# Patient Record
Sex: Female | Born: 1995
Health system: Southern US, Community
[De-identification: ages and names within clinical notes are randomized; demographics above are authoritative.]

## PROBLEM LIST (undated history)

## (undated) DIAGNOSIS — F1910 Other psychoactive substance abuse, uncomplicated: Secondary | ICD-10-CM

## (undated) DIAGNOSIS — O24419 Gestational diabetes mellitus in pregnancy, unspecified control: Secondary | ICD-10-CM

## (undated) DIAGNOSIS — O903 Peripartum cardiomyopathy: Secondary | ICD-10-CM

## (undated) DIAGNOSIS — I10 Essential (primary) hypertension: Secondary | ICD-10-CM

## (undated) DIAGNOSIS — I509 Heart failure, unspecified: Secondary | ICD-10-CM

## (undated) DIAGNOSIS — F329 Major depressive disorder, single episode, unspecified: Secondary | ICD-10-CM

## (undated) DIAGNOSIS — F99 Mental disorder, not otherwise specified: Secondary | ICD-10-CM

## (undated) DIAGNOSIS — R519 Headache, unspecified: Secondary | ICD-10-CM

## (undated) DIAGNOSIS — F1990 Other psychoactive substance use, unspecified, uncomplicated: Secondary | ICD-10-CM

## (undated) DIAGNOSIS — Z789 Other specified health status: Secondary | ICD-10-CM

## (undated) HISTORY — PX: NO PAST SURGERIES: SHX2092

## (undated) HISTORY — DX: Mental disorder, not otherwise specified: F99

## (undated) HISTORY — DX: Other specified health status: Z78.9

---

## 2016-03-17 NOTE — L&D Delivery Note (Signed)
Delivery Note At 6:45 PM a viable female was delivered via Vaginal, Spontaneous Delivery (Presentation: cephalic ; LOA ).  APGAR: 8, 9; weight  pending.   Placenta status: complete , 3 vessel.  Cord:  with the following complications: nuchal, reduced immediately post partum.  Cord pH: pending  Anesthesia:   Episiotomy: None Lacerations:  2nd degree, bilateral labial tear Suture Repair: 2.0 3.0 vicryl Est. Blood Loss (mL):  500  Mom to postpartum.  Baby to Couplet care / Skin to Skin.  Dyke Maes Cimolino, PA-S 12/25/2016, 7:45 PM  VAGINAL DELIVERY ATTESTATION  I was gloved and present for the delivery in its entirety, and I agree with the above resident's note.    [name], CNM 7:48 PM

## 2016-05-07 ENCOUNTER — Emergency Department (HOSPITAL_BASED_OUTPATIENT_CLINIC_OR_DEPARTMENT_OTHER)
Admission: EM | Admit: 2016-05-07 | Discharge: 2016-05-07 | Disposition: A | Discharge status: Home / Self Care | Payer: Self-pay | Attending: Dermatology | Admitting: Dermatology

## 2016-05-07 ENCOUNTER — Encounter (HOSPITAL_BASED_OUTPATIENT_CLINIC_OR_DEPARTMENT_OTHER): Payer: Self-pay | Admitting: *Deleted

## 2016-05-07 DIAGNOSIS — Z3A00 Weeks of gestation of pregnancy not specified: Secondary | ICD-10-CM | POA: Insufficient documentation

## 2016-05-07 DIAGNOSIS — F17210 Nicotine dependence, cigarettes, uncomplicated: Secondary | ICD-10-CM | POA: Insufficient documentation

## 2016-05-07 DIAGNOSIS — R1030 Lower abdominal pain, unspecified: Secondary | ICD-10-CM | POA: Insufficient documentation

## 2016-05-07 DIAGNOSIS — Z5321 Procedure and treatment not carried out due to patient leaving prior to being seen by health care provider: Secondary | ICD-10-CM | POA: Insufficient documentation

## 2016-05-07 DIAGNOSIS — O26899 Other specified pregnancy related conditions, unspecified trimester: Secondary | ICD-10-CM | POA: Insufficient documentation

## 2016-05-07 DIAGNOSIS — O99330 Smoking (tobacco) complicating pregnancy, unspecified trimester: Secondary | ICD-10-CM | POA: Insufficient documentation

## 2016-05-07 LAB — URINALYSIS, MICROSCOPIC (REFLEX)

## 2016-05-07 LAB — URINALYSIS, ROUTINE W REFLEX MICROSCOPIC
Bilirubin Urine: NEGATIVE
GLUCOSE, UA: NEGATIVE mg/dL
HGB URINE DIPSTICK: NEGATIVE
Ketones, ur: NEGATIVE mg/dL
Nitrite: NEGATIVE
Protein, ur: NEGATIVE mg/dL
SPECIFIC GRAVITY, URINE: 1.008 (ref 1.005–1.030)
pH: 7 (ref 5.0–8.0)

## 2016-05-07 LAB — PREGNANCY, URINE: Preg Test, Ur: POSITIVE — AB

## 2016-05-07 NOTE — ED Notes (Signed)
Called x 1 for room placement 

## 2016-05-07 NOTE — ED Notes (Signed)
No answer when called for exam room.  

## 2016-05-07 NOTE — ED Notes (Signed)
2nd time- no answer when called for triage

## 2016-05-07 NOTE — ED Triage Notes (Signed)
Pt c/o lower abd pain x 1 week, home preg test x 2 , no prenatal care, denies vag bleeding

## 2016-05-27 ENCOUNTER — Encounter: Payer: Self-pay | Admitting: Adult Health

## 2016-06-09 ENCOUNTER — Encounter: Payer: Self-pay | Admitting: Adult Health

## 2016-06-09 ENCOUNTER — Encounter: Payer: Self-pay | Admitting: *Deleted

## 2016-06-24 ENCOUNTER — Encounter (HOSPITAL_BASED_OUTPATIENT_CLINIC_OR_DEPARTMENT_OTHER): Payer: Self-pay | Admitting: *Deleted

## 2016-06-24 DIAGNOSIS — O2341 Unspecified infection of urinary tract in pregnancy, first trimester: Secondary | ICD-10-CM | POA: Diagnosis not present

## 2016-06-24 DIAGNOSIS — R102 Pelvic and perineal pain: Secondary | ICD-10-CM | POA: Diagnosis not present

## 2016-06-24 DIAGNOSIS — O99331 Smoking (tobacco) complicating pregnancy, first trimester: Secondary | ICD-10-CM | POA: Diagnosis not present

## 2016-06-24 DIAGNOSIS — F17210 Nicotine dependence, cigarettes, uncomplicated: Secondary | ICD-10-CM | POA: Diagnosis not present

## 2016-06-24 DIAGNOSIS — Z3A14 14 weeks gestation of pregnancy: Secondary | ICD-10-CM | POA: Diagnosis not present

## 2016-06-24 DIAGNOSIS — O219 Vomiting of pregnancy, unspecified: Secondary | ICD-10-CM | POA: Diagnosis present

## 2016-06-24 DIAGNOSIS — Z5181 Encounter for therapeutic drug level monitoring: Secondary | ICD-10-CM | POA: Diagnosis not present

## 2016-06-24 DIAGNOSIS — O23591 Infection of other part of genital tract in pregnancy, first trimester: Secondary | ICD-10-CM | POA: Diagnosis not present

## 2016-06-24 LAB — DIFFERENTIAL
Basophils Absolute: 0 10*3/uL (ref 0.0–0.1)
Basophils Relative: 0 %
Eosinophils Absolute: 0.1 10*3/uL (ref 0.0–0.7)
Eosinophils Relative: 2 %
LYMPHS PCT: 30 %
Lymphs Abs: 2.8 10*3/uL (ref 0.7–4.0)
MONO ABS: 0.4 10*3/uL (ref 0.1–1.0)
Monocytes Relative: 4 %
NEUTROS ABS: 5.8 10*3/uL (ref 1.7–7.7)
NEUTROS PCT: 64 %

## 2016-06-24 LAB — CBC
HCT: 35.9 % — ABNORMAL LOW (ref 36.0–46.0)
HEMOGLOBIN: 12.8 g/dL (ref 12.0–15.0)
MCH: 31.3 pg (ref 26.0–34.0)
MCHC: 35.7 g/dL (ref 30.0–36.0)
MCV: 87.8 fL (ref 78.0–100.0)
Platelets: 292 10*3/uL (ref 150–400)
RBC: 4.09 MIL/uL (ref 3.87–5.11)
RDW: 11.9 % (ref 11.5–15.5)
WBC: 9.1 10*3/uL (ref 4.0–10.5)

## 2016-06-24 NOTE — ED Triage Notes (Signed)
She is 4 months pregnant. Has received no prenatal care. Here today with vomiting and lower abdominal pain since this am. Thick white vaginal discharge.

## 2016-06-24 NOTE — ED Notes (Signed)
Patient reassessed, unable to urinate at this time. Reports that she continues to have nausea. Explained to the patient that we have to verify pregnancy prior to giving zofran

## 2016-06-25 ENCOUNTER — Emergency Department (HOSPITAL_BASED_OUTPATIENT_CLINIC_OR_DEPARTMENT_OTHER)
Admission: EM | Admit: 2016-06-25 | Discharge: 2016-06-25 | Disposition: A | Discharge status: Home / Self Care | Payer: Medicaid Other | Attending: Emergency Medicine | Admitting: Emergency Medicine

## 2016-06-25 DIAGNOSIS — B3749 Other urogenital candidiasis: Secondary | ICD-10-CM

## 2016-06-25 DIAGNOSIS — O219 Vomiting of pregnancy, unspecified: Secondary | ICD-10-CM

## 2016-06-25 DIAGNOSIS — O2341 Unspecified infection of urinary tract in pregnancy, first trimester: Secondary | ICD-10-CM

## 2016-06-25 LAB — RAPID URINE DRUG SCREEN, HOSP PERFORMED
Amphetamines: NOT DETECTED
BARBITURATES: NOT DETECTED
Benzodiazepines: NOT DETECTED
COCAINE: NOT DETECTED
Opiates: NOT DETECTED
Tetrahydrocannabinol: POSITIVE — AB

## 2016-06-25 LAB — URINALYSIS, ROUTINE W REFLEX MICROSCOPIC
Glucose, UA: NEGATIVE mg/dL
Hgb urine dipstick: NEGATIVE
Ketones, ur: NEGATIVE mg/dL
NITRITE: POSITIVE — AB
PROTEIN: NEGATIVE mg/dL
SPECIFIC GRAVITY, URINE: 1.021 (ref 1.005–1.030)
pH: 7 (ref 5.0–8.0)

## 2016-06-25 LAB — LIPASE, BLOOD: LIPASE: 18 U/L (ref 11–51)

## 2016-06-25 LAB — WET PREP, GENITAL
Clue Cells Wet Prep HPF POC: NONE SEEN
SPERM: NONE SEEN
TRICH WET PREP: NONE SEEN

## 2016-06-25 LAB — COMPREHENSIVE METABOLIC PANEL
ALT: 9 U/L — AB (ref 14–54)
AST: 15 U/L (ref 15–41)
Albumin: 3.9 g/dL (ref 3.5–5.0)
Alkaline Phosphatase: 54 U/L (ref 38–126)
Anion gap: 7 (ref 5–15)
BUN: 6 mg/dL (ref 6–20)
CHLORIDE: 103 mmol/L (ref 101–111)
CO2: 25 mmol/L (ref 22–32)
CREATININE: 0.38 mg/dL — AB (ref 0.44–1.00)
Calcium: 9.5 mg/dL (ref 8.9–10.3)
GFR calc Af Amer: >60 mL/min (ref >60)
GFR calc non Af Amer: >60 mL/min (ref >60)
Glucose, Bld: 98 mg/dL (ref 65–99)
POTASSIUM: 3.3 mmol/L — AB (ref 3.5–5.1)
SODIUM: 135 mmol/L (ref 135–145)
Total Bilirubin: 0.5 mg/dL (ref 0.3–1.2)
Total Protein: 7.3 g/dL (ref 6.5–8.1)

## 2016-06-25 LAB — PREGNANCY, URINE: Preg Test, Ur: POSITIVE — AB

## 2016-06-25 LAB — URINALYSIS, MICROSCOPIC (REFLEX)

## 2016-06-25 LAB — GC/CHLAMYDIA PROBE AMP (~~LOC~~) NOT AT ARMC
CHLAMYDIA, DNA PROBE: NEGATIVE
NEISSERIA GONORRHEA: NEGATIVE

## 2016-06-25 LAB — HCG, QUANTITATIVE, PREGNANCY: hCG, Beta Chain, Quant, S: 67563 m[IU]/mL — ABNORMAL HIGH (ref <5)

## 2016-06-25 MED ORDER — CEPHALEXIN 500 MG PO CAPS: 500.0000 mg | ORAL_CAPSULE | Freq: Two times a day (BID) | ORAL | 0 refills | Status: DC

## 2016-06-25 MED ORDER — MICONAZOLE NITRATE 2 % VA CREA: 1.0000 | TOPICAL_CREAM | Freq: Every day | VAGINAL | 0 refills | Status: DC

## 2016-06-25 MED ORDER — METOCLOPRAMIDE HCL 10 MG PO TABS
10.0000 mg | ORAL_TABLET | Freq: Once | ORAL | Status: AC
  Administered 2016-06-25: 10 mg via ORAL
  Filled 2016-06-25: qty 1

## 2016-06-25 MED ORDER — PRENATAL VITAMINS 28-0.8 MG PO TABS: 1.0000 | ORAL_TABLET | Freq: Every day | ORAL | 1 refills | Status: DC

## 2016-06-25 MED ORDER — METOCLOPRAMIDE HCL 10 MG PO TABS: 10.0000 mg | ORAL_TABLET | Freq: Four times a day (QID) | ORAL | 0 refills | Status: DC | PRN

## 2016-06-25 NOTE — ED Provider Notes (Signed)
[facility] Provider Note: Lowella Dell, MD, FACEP  CSN: [id number] MRN: [id number] ARRIVAL: 06/24/16 at 2221 ROOM: [facility]08/[facility]08   CHIEF COMPLAINT  Vomiting   HISTORY OF PRESENT ILLNESS  Laura Wagner is a 21 y.o. female who is at least several weeks pregnant (LMP 03/16/2016). She is here with nausea and vomiting that began yesterday. She was unable to keep anything on her stomach. She denies associated abdominal pain, diarrhea or dysuria. She denies vaginal bleeding that has had a vaginal discharge. She was given Reglan orally prior to my evaluation and has subsequently been able to drink fluids without emesis.   History reviewed. No pertinent past medical history.  History reviewed. No pertinent surgical history.  No family history on file.  Social History  Substance Use Topics  . Smoking status: Current Every Day Smoker    Packs/day: 1.00    Types: Cigarettes  . Smokeless tobacco: Never Used  . Alcohol use No    Prior to Admission medications   Not on File    Allergies Patient has no known allergies.   REVIEW OF SYSTEMS  Negative except as noted here or in the History of Present Illness.   PHYSICAL EXAMINATION  Initial Vital Signs Blood pressure 108/64, pulse 77, temperature 97.8 F (36.6 C), temperature source Oral, resp. rate 16, height  (1.499 m), weight 108 lb (49 kg), last menstrual period 03/16/2016, SpO2 100 %.  Examination General: Well-developed, well-nourished female in no acute distress; appearance consistent with age of record HENT: normocephalic; atraumatic Eyes: pupils equal, round and reactive to light; extraocular muscles intact Neck: supple Heart: regular rate and rhythm Lungs: clear to auscultation bilaterally Abdomen: soft; nondistended; mild suprapubic tenderness; no masses or hepatosplenomegaly; bowel sounds present GU: Normal external genitalia; no vaginal bleeding; mucoid cervical discharge; no cervical motion  tenderness; no adnexal tenderness; mildly enlarged uterus; viable IUP seen on bedside ultrasound Extremities: No deformity; full range of motion; pulses normal Neurologic: Awake, alert and oriented; motor function intact in all extremities and symmetric; no facial droop Skin: Warm and dry Psychiatric: Flat affect   RESULTS  Summary of this visit's results, reviewed by myself:   EKG Interpretation  Date/Time:    Ventricular Rate:    PR Interval:    QRS Duration:   QT Interval:    QTC Calculation:   R Axis:     Text Interpretation:        Laboratory Studies: Results for orders placed or performed during the hospital encounter of 06/25/16 (from the past 24 hour(s))  Lipase, blood     Status: None   Collection Time: 06/24/16 11:37 PM  Result Value Ref Range   Lipase 18 11 - 51 U/L  Comprehensive metabolic panel     Status: Abnormal   Collection Time: 06/24/16 11:37 PM  Result Value Ref Range   Sodium 135 135 - 145 mmol/L   Potassium 3.3 (L) 3.5 - 5.1 mmol/L   Chloride 103 101 - 111 mmol/L   CO2 25 22 - 32 mmol/L   Glucose, Bld 98 65 - 99 mg/dL   BUN 6 6 - 20 mg/dL   Creatinine, Ser 2.53 (L) 0.44 - 1.00 mg/dL   Calcium 9.5 8.9 - 66.4 mg/dL   Total Protein 7.3 6.5 - 8.1 g/dL   Albumin 3.9 3.5 - 5.0 g/dL   AST 15 15 - 41 U/L   ALT 9 (L) 14 - 54 U/L   Alkaline Phosphatase 54 38 - 126 U/L  Total Bilirubin 0.5 0.3 - 1.2 mg/dL   GFR calc non Af Amer >60 >60 mL/min   GFR calc Af Amer >60 >60 mL/min   Anion gap 7 5 - 15  CBC     Status: Abnormal   Collection Time: 06/24/16 11:37 PM  Result Value Ref Range   WBC 9.1 4.0 - 10.5 K/uL   RBC 4.09 3.87 - 5.11 MIL/uL   Hemoglobin 12.8 12.0 - 15.0 g/dL   HCT 16.1 (L) 09.6 - 04.5 %   MCV 87.8 78.0 - 100.0 fL   MCH 31.3 26.0 - 34.0 pg   MCHC 35.7 30.0 - 36.0 g/dL   RDW 40.9 81.1 - 91.4 %   Platelets 292 150 - 400 K/uL  Differential     Status: None   Collection Time: 06/24/16 11:37 PM  Result Value Ref Range   Neutrophils  Relative % 64 %   Neutro Abs 5.8 1.7 - 7.7 K/uL   Lymphocytes Relative 30 %   Lymphs Abs 2.8 0.7 - 4.0 K/uL   Monocytes Relative 4 %   Monocytes Absolute 0.4 0.1 - 1.0 K/uL   Eosinophils Relative 2 %   Eosinophils Absolute 0.1 0.0 - 0.7 K/uL   Basophils Relative 0 %   Basophils Absolute 0.0 0.0 - 0.1 K/uL  hCG, quantitative, pregnancy     Status: Abnormal   Collection Time: 06/24/16 11:37 PM  Result Value Ref Range   hCG, Beta Chain, Quant, S 78,295 (H) <5 mIU/mL  Pregnancy, urine     Status: Abnormal   Collection Time: 06/25/16 12:22 AM  Result Value Ref Range   Preg Test, Ur POSITIVE (A) NEGATIVE  Urinalysis, Routine w reflex microscopic     Status: Abnormal   Collection Time: 06/25/16 12:22 AM  Result Value Ref Range   Color, Urine AMBER (A) YELLOW   APPearance CLOUDY (A) CLEAR   Specific Gravity, Urine 1.021 1.005 - 1.030   pH 7.0 5.0 - 8.0   Glucose, UA NEGATIVE NEGATIVE mg/dL   Hgb urine dipstick NEGATIVE NEGATIVE   Bilirubin Urine SMALL (A) NEGATIVE   Ketones, ur NEGATIVE NEGATIVE mg/dL   Protein, ur NEGATIVE NEGATIVE mg/dL   Nitrite POSITIVE (A) NEGATIVE   Leukocytes, UA MODERATE (A) NEGATIVE  Urinalysis, Microscopic (reflex)     Status: Abnormal   Collection Time: 06/25/16 12:22 AM  Result Value Ref Range   RBC / HPF 0-5 0 - 5 RBC/hpf   WBC, UA 6-30 0 - 5 WBC/hpf   Bacteria, UA MANY (A) NONE SEEN   Squamous Epithelial / LPF 6-30 (A) NONE SEEN   Budding Yeast PRESENT    Ca Oxalate Crys, UA PRESENT   Rapid urine drug screen (hospital performed)     Status: Abnormal   Collection Time: 06/25/16 12:22 AM  Result Value Ref Range   Opiates NONE DETECTED NONE DETECTED   Cocaine NONE DETECTED NONE DETECTED   Benzodiazepines NONE DETECTED NONE DETECTED   Amphetamines NONE DETECTED NONE DETECTED   Tetrahydrocannabinol POSITIVE (A) NONE DETECTED   Barbiturates NONE DETECTED NONE DETECTED  Wet prep, genital     Status: Abnormal   Collection Time: 06/25/16  3:20 AM    Result Value Ref Range   Yeast Wet Prep HPF POC PRESENT (A) NONE SEEN   Trich, Wet Prep NONE SEEN NONE SEEN   Clue Cells Wet Prep HPF POC NONE SEEN NONE SEEN   WBC, Wet Prep HPF POC MANY (A) NONE SEEN   Sperm NONE SEEN  Imaging Studies: No results found.  ED COURSE  Nursing notes and initial vitals signs, including pulse oximetry, reviewed.  Vitals:   06/24/16 2235 06/24/16 2240 06/25/16 0148  BP:  123/81 108/64  Pulse:  98 77  Resp:  18 16  Temp:  97.8 F (36.6 C)   TempSrc:  Oral   SpO2:  100% 100%  Weight: 108 lb (49 kg)    Height:  (1.499 m)     3:44 AM The patient has a family practice PCP. She had a recent appointment with her but did not follow-up. She was advised to follow-up as she needs regular prenatal care.  PROCEDURES    ED DIAGNOSES     ICD-9-CM ICD-10-CM   1. Nausea and vomiting during pregnancy 643.90 O21.9   2. Urinary tract infection in mother during first trimester of pregnancy 646.63 O23.41    599.0    3. Candida infection of genital region 112.2 B37.49        Paula Libra, MD 06/25/16 0345

## 2016-06-25 NOTE — ED Notes (Signed)
Pt sleeping in bed when nurse goes in room.  Asked pt when her LMP was and she states it was the "end of December."  Asked pt what brings her in and pt states she "can't keep anything down."  Pt has not vomited since arrival and does not appear dehydrated at all.  Asked pt if she is having discharge and pt denies.  Asked pt about nurse's note in triage that suggests she is having discharge and pt then states that she is in fact having discharge.  Asked pt if she has an Chief Financial Officer and pt denies.  Asked pt when her last pelvic exam was, pt denies knowing when.  Asked pt when last STD check was and pt informs nurse that it was a few months ago.  When asked where she had her exam, pt stated that her PCP did a "PAP smear."  Clarified with pt that her "PCP" is actually her OB-GYN.  Pt had an appointment "a few weeks back, but I missed it."  Asked pt if she rescheduled and pt states she did not.  Pt is a G2P0 with a spontaneous abortion at 4 weeks.

## 2016-06-25 NOTE — ED Notes (Signed)
Pt verbalizes understanding of d/c instructions and denies any further needs at this time. 

## 2016-06-26 LAB — HIV ANTIBODY (ROUTINE TESTING W REFLEX): HIV Screen 4th Generation wRfx: NONREACTIVE

## 2016-06-27 LAB — URINE CULTURE

## 2016-06-28 ENCOUNTER — Telehealth: Payer: Self-pay

## 2016-06-28 NOTE — Telephone Encounter (Signed)
Post ED Visit - Positive Culture Follow-up  Culture report reviewed by antimicrobial stewardship pharmacist:   Enzo Bi, Pharm.D.  Celedonio Miyamoto, Pharm.D., BCPS AQ-ID  Garvin Fila, Pharm.D., BCPS  Georgina Pillion, [address].D., BCPS  [place], [address].D., BCPS, AAHIVP  Estella Husk, Pharm.D., BCPS, AAHIVP  Lysle Pearl, PharmD, BCPS  Casilda Carls, PharmD, BCPS  Pollyann Samples, PharmD, BCPS  Positive urine culture Treated with Cephalexin, organism sensitive to the same and no further patient follow-up is required at this time.  Jerry Caras 06/28/2016, 9:49 AM

## 2016-07-09 ENCOUNTER — Ambulatory Visit (INDEPENDENT_AMBULATORY_CARE_PROVIDER_SITE_OTHER): Payer: Medicaid Other | Admitting: Adult Health

## 2016-07-09 ENCOUNTER — Encounter: Payer: Self-pay | Admitting: Adult Health

## 2016-07-09 VITALS — BP 102/62 | HR 72 | Ht 59.0 in | Wt 111.0 lb

## 2016-07-09 DIAGNOSIS — Z3201 Encounter for pregnancy test, result positive: Secondary | ICD-10-CM

## 2016-07-09 DIAGNOSIS — Z3A17 17 weeks gestation of pregnancy: Secondary | ICD-10-CM

## 2016-07-09 DIAGNOSIS — N912 Amenorrhea, unspecified: Secondary | ICD-10-CM | POA: Diagnosis not present

## 2016-07-09 DIAGNOSIS — R1111 Vomiting without nausea: Secondary | ICD-10-CM

## 2016-07-09 DIAGNOSIS — Z363 Encounter for antenatal screening for malformations: Secondary | ICD-10-CM

## 2016-07-09 LAB — POCT URINE PREGNANCY: Preg Test, Ur: POSITIVE — AB

## 2016-07-09 NOTE — Progress Notes (Signed)
Subjective:     Patient ID: Laura Wagner, female   DOB: [birth year], 21 y.o.   MRN: [id number]  HPI Laura Wagner is a 21 year old white female in for UPT, has had +UPT at hospital in February and then recent seen for nausea and UTI.  Review of Systems +missed periods +nausea, was given reglan Reviewed past medical,surgical, social and family history. Reviewed medications and allergies.     Objective:   Physical Exam BP 102/62 (BP Location: Left Arm, Patient Position: Sitting, Cuff Size: Normal)   Pulse 72   Ht  (1.499 m)   Wt 111 lb (50.3 kg)   LMP 03/07/2016 (Approximate)   BMI 22.42 kg/m UPT +, about 23+5 weeks by LMP with EDD 12/12/16, Skin warm and dry. Neck: mid line trachea, normal thyroid, good ROM, no lymphadenopathy noted. Lungs: clear to ausculation bilaterally. Cardiovascular: regular rate and rhythm.Abdomen is soft and non tender, FHR 163 by doppler.Will get [place] next week. PHQ 9 score 10, denies being depressed or suicidal or homicidal just overwhelmed.     Assessment:     1. Pregnancy examination or test, positive result   2. [redacted] weeks gestation of pregnancy   3. Antenatal screening for malformation using ultrasonics       Plan:     Continue PNV Return in 1 week for anatomy US Review handout on second trimester and pregnancy Decrease smoking

## 2016-07-09 NOTE — Patient Instructions (Signed)

## 2016-07-16 ENCOUNTER — Ambulatory Visit (INDEPENDENT_AMBULATORY_CARE_PROVIDER_SITE_OTHER): Payer: Medicaid Other

## 2016-07-16 DIAGNOSIS — Z363 Encounter for antenatal screening for malformations: Secondary | ICD-10-CM

## 2016-07-16 NOTE — Progress Notes (Signed)
[place] 15+5 wks,breech,low lying ant pl gr 0,cx 3.9 cm,svp of fluid 3.2 cm,fhr 153 bpm,normal ovaries bilat,efw 134 g ,anatomy complete, EDD 01/02/2017 BY [place]

## 2016-07-28 ENCOUNTER — Ambulatory Visit: Payer: Self-pay | Admitting: *Deleted

## 2016-07-30 ENCOUNTER — Ambulatory Visit: Payer: Self-pay | Admitting: *Deleted

## 2016-07-30 ENCOUNTER — Ambulatory Visit (INDEPENDENT_AMBULATORY_CARE_PROVIDER_SITE_OTHER): Payer: Medicaid Other | Admitting: Women's Health

## 2016-07-30 ENCOUNTER — Other Ambulatory Visit (HOSPITAL_COMMUNITY)
Admission: RE | Admit: 2016-07-30 | Discharge: 2016-07-30 | Disposition: A | Discharge status: Home / Self Care | Payer: Medicaid Other | Source: Ambulatory Visit | Attending: Obstetrics & Gynecology | Admitting: Obstetrics & Gynecology

## 2016-07-30 ENCOUNTER — Encounter: Payer: Self-pay | Admitting: Women's Health

## 2016-07-30 VITALS — BP 120/82 | HR 85 | Wt 117.0 lb

## 2016-07-30 DIAGNOSIS — Z3A17 17 weeks gestation of pregnancy: Secondary | ICD-10-CM

## 2016-07-30 DIAGNOSIS — O0932 Supervision of pregnancy with insufficient antenatal care, second trimester: Secondary | ICD-10-CM

## 2016-07-30 DIAGNOSIS — Z3482 Encounter for supervision of other normal pregnancy, second trimester: Secondary | ICD-10-CM

## 2016-07-30 DIAGNOSIS — Z124 Encounter for screening for malignant neoplasm of cervix: Secondary | ICD-10-CM | POA: Diagnosis not present

## 2016-07-30 DIAGNOSIS — Z368A Encounter for antenatal screening for other genetic defects: Secondary | ICD-10-CM

## 2016-07-30 DIAGNOSIS — Z1389 Encounter for screening for other disorder: Secondary | ICD-10-CM

## 2016-07-30 DIAGNOSIS — Z363 Encounter for antenatal screening for malformations: Secondary | ICD-10-CM

## 2016-07-30 DIAGNOSIS — F17200 Nicotine dependence, unspecified, uncomplicated: Secondary | ICD-10-CM | POA: Diagnosis not present

## 2016-07-30 DIAGNOSIS — Z331 Pregnant state, incidental: Secondary | ICD-10-CM

## 2016-07-30 DIAGNOSIS — Z3490 Encounter for supervision of normal pregnancy, unspecified, unspecified trimester: Secondary | ICD-10-CM | POA: Insufficient documentation

## 2016-07-30 LAB — POCT URINALYSIS DIPSTICK
GLUCOSE UA: NEGATIVE
Ketones, UA: NEGATIVE
LEUKOCYTES UA: NEGATIVE
NITRITE UA: NEGATIVE
Protein, UA: NEGATIVE
RBC UA: NEGATIVE

## 2016-07-30 NOTE — Progress Notes (Signed)
  Subjective:  Laura Wagner is a 21 y.o. 272P0010 Caucasian female at 2853w5d by 15wk u/s, being seen today for her first obstetrical visit.  Her obstetrical history is significant for sab x 1; smoker- 3 cigarettes/day prior to pregnancy, now 1-2/day- wants to try to quit on her own.  Pregnancy history fully reviewed.  Patient reports no complaints. Denies vb, cramping, uti s/s, abnormal/malodorous vag d/c, or vulvovaginal itching/irritation.  BP 120/82   Pulse 85   Wt 117 lb (53.1 kg)   LMP 03/17/2016 (Approximate)   BMI 23.63 kg/m   HISTORY: OB History  Gravida Para Term Preterm AB Living  2       1    SAB TAB Ectopic Multiple Live Births  1            # Outcome Date GA Lbr Len/2nd Weight Sex Delivery Anes PTL Lv  2 Current           1 SAB 2017             Past Medical History:  Diagnosis Date  . Medical history non-contributory    Past Surgical History:  Procedure Laterality Date  . NO PAST SURGERIES     Family History  Problem Relation Age of Onset  . Cancer Maternal Grandmother        breast    Exam   System:     General: Well developed & nourished, no acute distress   Skin: Warm & dry, normal coloration and turgor, no rashes   Neurologic: Alert & oriented, normal mood   Cardiovascular: Regular rate & rhythm   Respiratory: Effort & rate normal, LCTAB, acyanotic   Abdomen: Soft, non tender   Extremities: normal strength, tone   Pelvic Exam:    Perineum: Normal perineum   Vulva: Normal, no lesions   Vagina:  Normal mucosa, normal discharge   Cervix: Normal, bulbous, appears closed   Uterus: Normal size/shape/contour for GA   Thin prep pap smear obtained w/ reflex high risk HPV cotesting FHR: 160 via doppler   Assessment:   Pregnancy: G2P0010 Patient Active Problem List   Diagnosis Date Noted  . Supervision of normal pregnancy 07/30/2016  . Late prenatal care in second trimester 07/30/2016  . Smoker 07/30/2016    3353w5d G2P0010 New OB visit Late  care Smoker  Plan:  Initial labs obtained Continue prenatal vitamins Problem list reviewed and updated Reviewed n/v relief measures and warning s/s to report Reviewed recommended weight gain based on pre-gravid BMI Encouraged well-balanced diet Genetic Screening discussed Quad Screen: requested and ordered today Cystic fibrosis screening discussed declined Ultrasound discussed; fetal survey: requested Follow up in 3 weeks for anatomy u/s and visit CCNC completed NFPartnership offered, accepted, referral faxed  Smokes 1-2 cigarettes/day, advised cessation, discussed risks to fetus while pregnant, to infant pp, and to herself. Offered QuitlineNC, declined  Marge DuncansBooker, Belicia Difatta Randall CNM, [facility]WHNP-BC 07/30/2016 2:12 PM

## 2016-07-30 NOTE — Patient Instructions (Signed)

## 2016-08-01 LAB — CYTOLOGY - PAP
Adequacy: ABSENT
Chlamydia: NEGATIVE
Diagnosis: NEGATIVE
NEISSERIA GONORRHEA: NEGATIVE

## 2016-08-02 LAB — AFP, QUAD SCREEN
DIA Mom Value: 1.52
DIA VALUE (EIA): 297.74 pg/mL
DSR (By Age)    1 IN: 1108
DSR (Second Trimester) 1 IN: 4667
GESTATIONAL AGE AFP: 17.7 wk
MATERNAL AGE AT EDD: 22.8 a
MSAFP MOM: 0.95
MSAFP: 45.9 ng/mL
MSHCG Mom: 0.68
MSHCG: 22841 m[IU]/mL
OSB RISK: 10000
T18 (By Age): 1:4318 {titer}
Test Results:: NEGATIVE
UE3 MOM: 0.89
WEIGHT: 117 [lb_av]
uE3 Value: 1.13 ng/mL

## 2016-08-02 LAB — PMP SCREEN PROFILE (10S), URINE
Amphetamine Scrn, Ur: NEGATIVE ng/mL
BARBITURATE SCREEN URINE: NEGATIVE ng/mL
BENZODIAZEPINE SCREEN, URINE: NEGATIVE ng/mL
CANNABINOIDS UR QL SCN: NEGATIVE ng/mL
COCAINE(METAB.)SCREEN, URINE: NEGATIVE ng/mL
Creatinine(Crt), U: 16.3 mg/dL — ABNORMAL LOW (ref 20.0–300.0)
Methadone Screen, Urine: NEGATIVE ng/mL
OXYCODONE+OXYMORPHONE UR QL SCN: NEGATIVE ng/mL
Opiate Scrn, Ur: NEGATIVE ng/mL
Ph of Urine: 7.3 (ref 4.5–8.9)
Phencyclidine Qn, Ur: NEGATIVE ng/mL
Propoxyphene Scrn, Ur: NEGATIVE ng/mL

## 2016-08-02 LAB — SPECIFIC GRAVITY (REFLEXED): SPECIFIC GRAVITY: 1.0046

## 2016-08-02 LAB — URINE CULTURE

## 2016-08-06 LAB — CBC
Hematocrit: 33.8 % — ABNORMAL LOW (ref 34.0–46.6)
Hemoglobin: 11.5 g/dL (ref 11.1–15.9)
MCH: 30.7 pg (ref 26.6–33.0)
MCHC: 34 g/dL (ref 31.5–35.7)
MCV: 90 fL (ref 79–97)
Platelets: 235 10*3/uL (ref 150–379)
RBC: 3.74 x10E6/uL — ABNORMAL LOW (ref 3.77–5.28)
RDW: 13.9 % (ref 12.3–15.4)
WBC: 11.8 10*3/uL — ABNORMAL HIGH (ref 3.4–10.8)

## 2016-08-06 LAB — ABO/RH: RH TYPE: POSITIVE

## 2016-08-06 LAB — HIV ANTIBODY (ROUTINE TESTING W REFLEX): HIV SCREEN 4TH GENERATION: NONREACTIVE

## 2016-08-06 LAB — RUBELLA SCREEN: RUBELLA: 1.64 {index} (ref >0.99)

## 2016-08-06 LAB — URINALYSIS, ROUTINE W REFLEX MICROSCOPIC

## 2016-08-06 LAB — RPR: RPR Ser Ql: NONREACTIVE

## 2016-08-06 LAB — ANTIBODY SCREEN: Antibody Screen: NEGATIVE

## 2016-08-06 LAB — HEPATITIS B SURFACE ANTIGEN: HEP B S AG: NEGATIVE

## 2016-08-06 LAB — VARICELLA ZOSTER ANTIBODY, IGG: Varicella zoster IgG: 690 index (ref >165)

## 2016-08-07 ENCOUNTER — Telehealth: Payer: Self-pay | Admitting: Obstetrics & Gynecology

## 2016-08-07 NOTE — Telephone Encounter (Signed)
Pt called with question regarding a charge on her bill. Transferred her to front office to help answer her question.

## 2016-08-20 ENCOUNTER — Other Ambulatory Visit: Payer: Self-pay

## 2016-08-20 ENCOUNTER — Encounter: Payer: Self-pay | Admitting: Obstetrics and Gynecology

## 2016-08-26 ENCOUNTER — Ambulatory Visit (INDEPENDENT_AMBULATORY_CARE_PROVIDER_SITE_OTHER): Payer: Medicaid Other

## 2016-08-26 ENCOUNTER — Ambulatory Visit (INDEPENDENT_AMBULATORY_CARE_PROVIDER_SITE_OTHER): Payer: Medicaid Other | Admitting: Obstetrics & Gynecology

## 2016-08-26 ENCOUNTER — Encounter: Payer: Self-pay | Admitting: Obstetrics & Gynecology

## 2016-08-26 VITALS — BP 112/64 | HR 82 | Wt 120.0 lb

## 2016-08-26 DIAGNOSIS — Z363 Encounter for antenatal screening for malformations: Secondary | ICD-10-CM

## 2016-08-26 DIAGNOSIS — O0932 Supervision of pregnancy with insufficient antenatal care, second trimester: Secondary | ICD-10-CM

## 2016-08-26 DIAGNOSIS — Z1389 Encounter for screening for other disorder: Secondary | ICD-10-CM

## 2016-08-26 DIAGNOSIS — Z331 Pregnant state, incidental: Secondary | ICD-10-CM

## 2016-08-26 DIAGNOSIS — Z3482 Encounter for supervision of other normal pregnancy, second trimester: Secondary | ICD-10-CM

## 2016-08-26 DIAGNOSIS — Z3402 Encounter for supervision of normal first pregnancy, second trimester: Secondary | ICD-10-CM

## 2016-08-26 LAB — POCT URINALYSIS DIPSTICK
GLUCOSE UA: NEGATIVE
Ketones, UA: NEGATIVE
NITRITE UA: NEGATIVE
Protein, UA: NEGATIVE
RBC UA: NEGATIVE

## 2016-08-26 MED ORDER — PRENATAL VITAMINS 28-0.8 MG PO TABS: 1.0000 | ORAL_TABLET | Freq: Every day | ORAL | 11 refills | Status: DC

## 2016-08-26 NOTE — Progress Notes (Signed)
US 21+4 wks,cephalic,cx 3.8 cm,ant pl gr 0,resolved low lying pl ,3.7 cm from cx os,normal ovaries bilat,svp of fluid 5.8 cm,fhr 152 bpm,efw 436 g,anatomy complete,no obvious abnormalities seen

## 2016-08-26 NOTE — Progress Notes (Signed)
G2P0010 2665w4d Estimated Date of Delivery: 01/02/17  Blood pressure 112/64, pulse 82, weight 120 lb (54.4 kg), last menstrual period 03/17/2016.   BP weight and urine results all reviewed and noted.  Please refer to the obstetrical flow sheet for the fundal height and fetal heart rate documentation:  Patient reports good fetal movement, denies any bleeding and no rupture of membranes symptoms or regular contractions. Patient is without complaints. All questions were answered.  Orders Placed This Encounter  Procedures  . POCT urinalysis dipstick    Plan:  Continued routine obstetrical care, sonogram is normal see report  Return in about 4 weeks (around 09/23/2016) for LROB.

## 2016-09-23 ENCOUNTER — Encounter: Payer: Self-pay | Admitting: Women's Health

## 2016-09-23 ENCOUNTER — Ambulatory Visit (INDEPENDENT_AMBULATORY_CARE_PROVIDER_SITE_OTHER): Payer: Medicaid Other | Admitting: Women's Health

## 2016-09-23 VITALS — BP 110/68 | HR 76 | Wt 129.0 lb

## 2016-09-23 DIAGNOSIS — Z331 Pregnant state, incidental: Secondary | ICD-10-CM

## 2016-09-23 DIAGNOSIS — Z3482 Encounter for supervision of other normal pregnancy, second trimester: Secondary | ICD-10-CM

## 2016-09-23 DIAGNOSIS — Z1389 Encounter for screening for other disorder: Secondary | ICD-10-CM

## 2016-09-23 DIAGNOSIS — Z3A25 25 weeks gestation of pregnancy: Secondary | ICD-10-CM

## 2016-09-23 LAB — POCT URINALYSIS DIPSTICK
Blood, UA: NEGATIVE
Glucose, UA: NEGATIVE
Ketones, UA: NEGATIVE
Nitrite, UA: NEGATIVE
PROTEIN UA: NEGATIVE

## 2016-09-23 NOTE — Progress Notes (Signed)
Low-risk OB appointment G2P0010 5366w4d Estimated Date of Delivery: 01/02/17 BP 110/68   Pulse 76   Wt 129 lb (58.5 kg)   LMP 03/17/2016 (Approximate)   BMI 26.05 kg/m   BP, weight, and urine reviewed.  Refer to obstetrical flow sheet for FH & FHR.  Reports good fm.  Denies regular uc's, lof, vb, or uti s/s. No complaints. Reviewed ptl s/s, fm. Plan:  Continue routine obstetrical care  F/U in 3wks for OB appointment and pn2

## 2016-09-23 NOTE — Patient Instructions (Addendum)
You will have your sugar test next visit.  Please do not eat or drink anything after midnight the night before you come, not even water.  You will be here for at least two hours.     Call the office ([phone]) or go to [facility] if:  You begin to have strong, frequent contractions  Your water breaks.  Sometimes it is a big gush of fluid, sometimes it is just a trickle that keeps getting your panties wet or running down your legs  You have vaginal bleeding.  It is normal to have a small amount of spotting if your cervix was checked.   You don't feel your baby moving like normal.  If you don't, get you something to eat and drink and lay down and focus on feeling your baby move.   If your baby is [place] not moving like normal, you should call the office or go to [facility].  Second Trimester of Pregnancy The second trimester is from week 13 through week 28, months 4 through 6. The second trimester is often a time when you feel your best. Your body has also adjusted to being pregnant, and you begin to feel better physically. Usually, morning sickness has lessened or quit completely, you may have more energy, and you may have an increase in appetite. The second trimester is also a time when the fetus is growing rapidly. At the end of the sixth month, the fetus is about 9 inches long and weighs about 1 pounds. You will likely begin to feel the baby move (quickening) between 18 and 20 weeks of the pregnancy. BODY CHANGES Your body goes through many changes during pregnancy. The changes vary from woman to woman.   Your weight will continue to increase. You will notice your lower abdomen bulging out.  You may begin to get stretch marks on your hips, abdomen, and breasts.  You may develop headaches that can be relieved by medicines approved by your health care provider.  You may urinate more often because the fetus is pressing on your bladder.  You may develop or continue to have  heartburn as a result of your pregnancy.  You may develop constipation because certain hormones are causing the muscles that push waste through your intestines to slow down.  You may develop hemorrhoids or swollen, bulging veins (varicose veins).  You may have back pain because of the weight gain and pregnancy hormones relaxing your joints between the bones in your pelvis and as a result of a shift in weight and the muscles that support your balance.  Your breasts will continue to grow and be tender.  Your gums may bleed and may be sensitive to brushing and flossing.  Dark spots or blotches (chloasma, mask of pregnancy) may develop on your face. This will likely fade after the baby is born.  A dark line from your belly button to the pubic area (linea nigra) may appear. This will likely fade after the baby is born.  You may have changes in your hair. These can include thickening of your hair, rapid growth, and changes in texture. Some women also have hair loss during or after pregnancy, or hair that feels dry or thin. Your hair will most likely return to normal after your baby is born. WHAT TO EXPECT AT YOUR PRENATAL VISITS During a routine prenatal visit:  You will be weighed to make sure you and the fetus are growing normally.  Your blood pressure will be taken.    Your abdomen will be measured to track your baby's growth.  The fetal heartbeat will be listened to.  Any test results from the previous visit will be discussed. Your health care provider may ask you:  How you are feeling.  If you are feeling the baby move.  If you have had any abnormal symptoms, such as leaking fluid, bleeding, severe headaches, or abdominal cramping.  If you have any questions. Other tests that may be performed during your second trimester include:  Blood tests that check for:  Low iron levels (anemia).  Gestational diabetes (between 24 and 28 weeks).  Rh antibodies.  Urine tests to check  for infections, diabetes, or protein in the urine.  An ultrasound to confirm the proper growth and development of the baby.  An amniocentesis to check for possible genetic problems.  Fetal screens for spina bifida and Down syndrome. HOME CARE INSTRUCTIONS   Avoid all smoking, herbs, alcohol, and unprescribed drugs. These chemicals affect the formation and growth of the baby.  Follow your health care provider's instructions regarding medicine use. There are medicines that are either safe or unsafe to take during pregnancy.  Exercise only as directed by your health care provider. Experiencing uterine cramps is a good sign to stop exercising.  Continue to eat regular, healthy meals.  Wear a good support bra for breast tenderness.  Do not use hot tubs, steam rooms, or saunas.  Wear your seat belt at all times when driving.  Avoid raw meat, uncooked cheese, cat litter boxes, and soil used by cats. These carry germs that can cause birth defects in the baby.  Take your prenatal vitamins.  Try taking a stool softener (if your health care provider approves) if you develop constipation. Eat more high-fiber foods, such as fresh vegetables or fruit and whole grains. Drink plenty of fluids to keep your urine clear or pale yellow.  Take warm sitz baths to soothe any pain or discomfort caused by hemorrhoids. Use hemorrhoid cream if your health care provider approves.  If you develop varicose veins, wear support hose. Elevate your feet for 15 minutes, 3-4 times a day. Limit salt in your diet.  Avoid heavy lifting, wear low heel shoes, and practice good posture.  Rest with your legs elevated if you have leg cramps or low back pain.  Visit your dentist if you have not gone yet during your pregnancy. Use a soft toothbrush to brush your teeth and be gentle when you floss.  A sexual relationship may be continued unless your health care provider directs you otherwise.  Continue to go to all your  prenatal visits as directed by your health care provider. SEEK MEDICAL CARE IF:   You have dizziness.  You have mild pelvic cramps, pelvic pressure, or nagging pain in the abdominal area.  You have persistent nausea, vomiting, or diarrhea.  You have a bad smelling vaginal discharge.  You have pain with urination. SEEK IMMEDIATE MEDICAL CARE IF:   You have a fever.  You are leaking fluid from your vagina.  You have spotting or bleeding from your vagina.  You have severe abdominal cramping or pain.  You have rapid weight gain or loss.  You have shortness of breath with chest pain.  You notice sudden or extreme swelling of your face, hands, ankles, feet, or legs.  You have not felt your baby move in over an hour.  You have severe headaches that do not go away with medicine.  You have vision changes.   Document Released: 02/25/2001 Document Revised: 03/08/2013 Document Reviewed: 05/04/2012 [facility] Patient Information 2015 [place]. This information is not intended to replace advice given to you by your health care provider. Make sure you discuss any questions you have with your health care provider.  [place] Pediatricians/Family Doctors:  [facility] [phone]            [facility] [phone]                 [facility] [phone] (usually not accepting new patients unless you have family there already, you are always welcome to call and ask)            [facility]/Family Doctors:   [facility]: [phone]  [facility]: [phone]   AREA PEDIATRIC/FAMILY PRACTICE PHYSICIANS  [facility] [address] [place], [place] [address] Phone - [phone]   Fax - [phone]  JACK AMOS [address] [place], [place]  [address] Phone - [phone]   Fax - [phone]  [facility] [address], [address] [place], [place]  [address] Phone -  [phone]   Fax - [phone]  [facility] [address] [place], [place]  [address] Phone - [phone]   Fax - [phone]  [facility] [address], [address] [place], [place]  [address] Phone - [phone]   Fax - [phone]  [facility] [address], [address] [place], [place]  [address] Phone - [phone]   Fax - [phone]  [facility] [address], [address] [place], [place]  [address] Phone - [phone]   Fax - [phone]  [facility] [address], [address] [place], [place]  [address] Phone - [phone]   Fax - [phone]  [facility] [address] [place], [place]  [address] Phone - [phone]   Fax - [phone] [facility] [address] [place], [place]  [address] Phone - [phone]   Fax - [phone]  [facility] [address] [place], [place]  [address] Phone - [phone]   Fax - [phone]  [facility] [address], [address] [place], [place]  [address] Phone - [phone]   Fax - [phone]  [facility] [address], [address] [place], [place]  [address] Phone - [phone]   Fax - [phone]  [facility] [address], [address] [place], [place]  [address] Phone - [phone]  [facility] [address] [place], [place]  [address] Phone - [phone]   Fax - [phone]  [facility] [address], [address] [place], [place]  [address] Phone - [phone]   Fax - [phone]  [facility] [address] [place], [place]  [address] Phone - [phone]   Fax - [phone]  [facility] [address] [place], [place]  [address] Phone - [phone]   Fax - [phone]   [facility] [address], [address] [place], [place]  [address] Phone - [phone]   Fax - [phone]  [facility] [address], [address] [place], [place]  [address] Phone - [phone]   Fax - [phone]  DAVID RUBIN [address], [address] [place], [place]  [address] Phone - [phone]   Fax - [phone]  [facility] [address], [address] [phone]  [place], [place]  [address] Phone - [phone]   Fax - [phone]  [facility] [address] [place], [place]  [address] Phone - [phone]   Fax - [phone] Gerarda Fraction [address] [place], [place]  [address] Phone - [phone]   Fax - [phone]  [facility] [address] [place], [place]  [address] Phone - [phone]   Fax - [phone]  [facility] [place] [address] [place], [place]  [address] Phone - [phone]   Fax - [phone]

## 2016-10-15 ENCOUNTER — Ambulatory Visit (INDEPENDENT_AMBULATORY_CARE_PROVIDER_SITE_OTHER): Payer: Medicaid Other | Admitting: Obstetrics and Gynecology

## 2016-10-15 ENCOUNTER — Other Ambulatory Visit: Payer: Medicaid Other

## 2016-10-15 ENCOUNTER — Encounter: Payer: Self-pay | Admitting: Obstetrics and Gynecology

## 2016-10-15 VITALS — BP 110/70 | HR 84 | Wt 133.2 lb

## 2016-10-15 DIAGNOSIS — Z331 Pregnant state, incidental: Secondary | ICD-10-CM

## 2016-10-15 DIAGNOSIS — Z131 Encounter for screening for diabetes mellitus: Secondary | ICD-10-CM

## 2016-10-15 DIAGNOSIS — Z1389 Encounter for screening for other disorder: Secondary | ICD-10-CM

## 2016-10-15 DIAGNOSIS — Z3403 Encounter for supervision of normal first pregnancy, third trimester: Secondary | ICD-10-CM

## 2016-10-15 DIAGNOSIS — Z3483 Encounter for supervision of other normal pregnancy, third trimester: Secondary | ICD-10-CM

## 2016-10-15 DIAGNOSIS — Z3A28 28 weeks gestation of pregnancy: Secondary | ICD-10-CM

## 2016-10-15 DIAGNOSIS — Z3402 Encounter for supervision of normal first pregnancy, second trimester: Secondary | ICD-10-CM

## 2016-10-15 LAB — POCT URINALYSIS DIPSTICK
GLUCOSE UA: NEGATIVE
KETONES UA: NEGATIVE
Nitrite, UA: NEGATIVE
Protein, UA: NEGATIVE

## 2016-10-15 NOTE — Progress Notes (Signed)
Patient ID: Laura Wagner, female   DOB: 1996-02-23, 21 y.o.   MRN: [id number]  G2P0010  Estimated [facility]  Chief Complaint  Patient presents with  . Routine Prenatal Visit    PN2; both hands go numb off and on  ____  Patient complaints: No complaints. Patient reports good fetal movement, denies any bleeding, rupture of membranes,or regular contractions. Turned down child birthing classes. Pt reports the baby's father and his mother will be in the room with the birth o.  Blood pressure 110/70, pulse 84, weight 133 lb 3.2 oz (60.4 kg), last menstrual period 03/17/2016.   Urine results:notable for 3+ Leukocytes trace of blood otherwise negative pt collected first portion of void suspect contaminants refer to the ob flow sheet for FH and FHR, ,                          Physical Examination: General appearance - alert, well appearing, and in no distress                                      Abdomen - FH 29 cm                                                        -FHR 159 Questions were answered. Assessment: LROB G2P0010 @ 4841w5d  Plan:  Continued routine obstetrical care,  F/u in 4 weeks for LROB    By signing my name below, I, Diona BrownerJennifer Gorman, attest that this documentation has been prepared under the direction and in the presence of Tilda BurrowFerguson, Zeus Marquis V, MD. Electronically Signed: Diona BrownerJennifer Gorman, Medical Scribe. 10/15/16. 9:18 AM.  I personally performed the services described in this documentation, which was SCRIBED in my presence. The recorded information has been reviewed and considered accurate. It has been edited as necessary during review. Tilda BurrowFERGUSON,Oluwaferanmi Wain V, MD

## 2016-10-16 LAB — HIV ANTIBODY (ROUTINE TESTING W REFLEX): HIV Screen 4th Generation wRfx: NONREACTIVE

## 2016-10-16 LAB — GLUCOSE TOLERANCE, 2 HOURS W/ 1HR
GLUCOSE, 1 HOUR: 169 mg/dL (ref 65–179)
GLUCOSE, 2 HOUR: 147 mg/dL (ref 65–152)
GLUCOSE, FASTING: 64 mg/dL — AB (ref 65–91)

## 2016-10-16 LAB — CBC
HEMATOCRIT: 36.6 % (ref 34.0–46.6)
Hemoglobin: 12.5 g/dL (ref 11.1–15.9)
MCH: 32.1 pg (ref 26.6–33.0)
MCHC: 34.2 g/dL (ref 31.5–35.7)
MCV: 94 fL (ref 79–97)
PLATELETS: 243 10*3/uL (ref 150–379)
RBC: 3.89 x10E6/uL (ref 3.77–5.28)
RDW: 12.9 % (ref 12.3–15.4)
WBC: 17.6 10*3/uL — AB (ref 3.4–10.8)

## 2016-10-16 LAB — RPR: RPR Ser Ql: NONREACTIVE

## 2016-10-16 LAB — ANTIBODY SCREEN: ANTIBODY SCREEN: NEGATIVE

## 2016-11-12 ENCOUNTER — Encounter: Payer: Self-pay | Admitting: Advanced Practice Midwife

## 2016-11-12 ENCOUNTER — Ambulatory Visit (INDEPENDENT_AMBULATORY_CARE_PROVIDER_SITE_OTHER): Payer: Medicaid Other | Admitting: Advanced Practice Midwife

## 2016-11-12 VITALS — BP 98/62 | HR 77 | Wt 131.0 lb

## 2016-11-12 DIAGNOSIS — Z331 Pregnant state, incidental: Secondary | ICD-10-CM

## 2016-11-12 DIAGNOSIS — Z1389 Encounter for screening for other disorder: Secondary | ICD-10-CM

## 2016-11-12 DIAGNOSIS — Z3A32 32 weeks gestation of pregnancy: Secondary | ICD-10-CM

## 2016-11-12 DIAGNOSIS — Z3483 Encounter for supervision of other normal pregnancy, third trimester: Secondary | ICD-10-CM

## 2016-11-12 LAB — POCT URINALYSIS DIPSTICK
Blood, UA: NEGATIVE
GLUCOSE UA: NEGATIVE
Ketones, UA: NEGATIVE
NITRITE UA: NEGATIVE
Protein, UA: NEGATIVE

## 2016-11-12 NOTE — Progress Notes (Signed)
G2P0010 1241w5d Estimated Date of Delivery: 01/02/17  Blood pressure 98/62, pulse 77, weight 131 lb (59.4 kg), last menstrual period 03/17/2016.   BP weight and urine results all reviewed and noted.  Please refer to the obstetrical flow sheet for the fundal height and fetal heart rate documentation:  Patient reports good fetal movement, denies any bleeding and no rupture of membranes symptoms or regular contractions. Patient is without complaints. All questions were answered.  Orders Placed This Encounter  Procedures  . POCT urinalysis dipstick    Plan:  Continued routine obstetrical care,   Return in about 2 weeks (around 11/26/2016) for LROB.

## 2016-11-12 NOTE — Patient Instructions (Signed)

## 2016-11-27 ENCOUNTER — Encounter: Payer: Medicaid Other | Admitting: Obstetrics & Gynecology

## 2016-12-03 ENCOUNTER — Encounter: Payer: Medicaid Other | Admitting: Advanced Practice Midwife

## 2016-12-04 ENCOUNTER — Ambulatory Visit (INDEPENDENT_AMBULATORY_CARE_PROVIDER_SITE_OTHER): Payer: Medicaid Other | Admitting: Obstetrics & Gynecology

## 2016-12-04 ENCOUNTER — Encounter: Payer: Self-pay | Admitting: Obstetrics & Gynecology

## 2016-12-04 VITALS — BP 102/70 | HR 71 | Wt 132.0 lb

## 2016-12-04 DIAGNOSIS — Z1389 Encounter for screening for other disorder: Secondary | ICD-10-CM

## 2016-12-04 DIAGNOSIS — Z3A35 35 weeks gestation of pregnancy: Secondary | ICD-10-CM | POA: Diagnosis not present

## 2016-12-04 DIAGNOSIS — Z3483 Encounter for supervision of other normal pregnancy, third trimester: Secondary | ICD-10-CM

## 2016-12-04 DIAGNOSIS — Z331 Pregnant state, incidental: Secondary | ICD-10-CM

## 2016-12-04 LAB — POCT URINALYSIS DIPSTICK
GLUCOSE UA: NEGATIVE
Leukocytes, UA: NEGATIVE
Nitrite, UA: NEGATIVE
Protein, UA: NEGATIVE
RBC UA: NEGATIVE

## 2016-12-04 NOTE — Progress Notes (Signed)
G2P0010 [redacted]w[redacted]d Estimated Date of Delivery: 01/02/17  Blood pressure 102/70, pulse 71, weight 132 lb (59.9 kg), last menstrual period 03/17/2016.   BP weight and urine results all reviewed and noted.  Please refer to the obstetrical flow sheet for the fundal height and fetal heart rate documentation:  Patient reports good fetal movement, denies any bleeding and no rupture of membranes symptoms or regular contractions. Patient is without complaints. All questions were answered.  Orders Placed This Encounter  Procedures  . POCT urinalysis dipstick    Plan:  Continued routine obstetrical care, some back pain  Return in about 1 week (around 12/11/2016).

## 2016-12-11 ENCOUNTER — Encounter: Payer: Medicaid Other | Admitting: Women's Health

## 2016-12-17 ENCOUNTER — Encounter: Payer: Medicaid Other | Admitting: Women's Health

## 2016-12-18 ENCOUNTER — Telehealth: Payer: Self-pay | Admitting: *Deleted

## 2016-12-18 NOTE — Telephone Encounter (Signed)
Laura Wagner from [facility] called to inform us that Laura Wagner's BP was 130/92. I spoke with Laura Wagner who states she did not have a headache, blurred vision, or epigastric pain. She states there is some swelling in her feet. She states that baby is moving fine, no c/o bleeding, cramping, or leaking fluid. Informed pt that she needed to make her routine appt as she missed her last one and did not reschedule and we would assess her BP at that visit. Advised to watch sodium intake and drink plenty of fluids. Advised her to go to [facility] if she experiences severe headache that wont go away, starts seeing spots or having blurred vision, or if she experiences epigastric pain below right ribs or right side chest pain. Pt verbalized understanding.

## 2016-12-19 ENCOUNTER — Encounter: Payer: Medicaid Other | Admitting: Obstetrics and Gynecology

## 2016-12-22 ENCOUNTER — Telehealth: Payer: Self-pay | Admitting: *Deleted

## 2016-12-22 ENCOUNTER — Encounter: Payer: Medicaid Other | Admitting: Women's Health

## 2016-12-22 NOTE — Telephone Encounter (Signed)
Patient called requesting to be worked in for a sore throat. Informed patient that she missed her appointment today and no other appointments were available. Advised patient to try using cepacol lozenges, chloraseptic spray, claritin/zytec if allergy related. Verbalized understanding. Informed patient she needed to schedule appointment. Stated she would so call was put on hold to make appointment but pt hung up.

## 2016-12-25 ENCOUNTER — Inpatient Hospital Stay (HOSPITAL_COMMUNITY): Payer: Medicaid Other | Admitting: Anesthesiology

## 2016-12-25 ENCOUNTER — Encounter (HOSPITAL_COMMUNITY): Payer: Self-pay | Admitting: *Deleted

## 2016-12-25 ENCOUNTER — Inpatient Hospital Stay (HOSPITAL_COMMUNITY)
Admission: AD | Admit: 2016-12-25 | Discharge: 2016-12-27 | DRG: 807 | Disposition: A | Discharge status: Home / Self Care | Payer: Medicaid Other | Source: Ambulatory Visit | Attending: Family Medicine | Admitting: Family Medicine

## 2016-12-25 DIAGNOSIS — Z3490 Encounter for supervision of normal pregnancy, unspecified, unspecified trimester: Secondary | ICD-10-CM

## 2016-12-25 DIAGNOSIS — Z3A38 38 weeks gestation of pregnancy: Secondary | ICD-10-CM | POA: Diagnosis not present

## 2016-12-25 DIAGNOSIS — O26893 Other specified pregnancy related conditions, third trimester: Secondary | ICD-10-CM | POA: Diagnosis present

## 2016-12-25 DIAGNOSIS — O1414 Severe pre-eclampsia complicating childbirth: Principal | ICD-10-CM | POA: Diagnosis present

## 2016-12-25 DIAGNOSIS — O09299 Supervision of pregnancy with other poor reproductive or obstetric history, unspecified trimester: Secondary | ICD-10-CM | POA: Diagnosis present

## 2016-12-25 DIAGNOSIS — F17210 Nicotine dependence, cigarettes, uncomplicated: Secondary | ICD-10-CM | POA: Diagnosis present

## 2016-12-25 DIAGNOSIS — Z3483 Encounter for supervision of other normal pregnancy, third trimester: Secondary | ICD-10-CM

## 2016-12-25 DIAGNOSIS — F17200 Nicotine dependence, unspecified, uncomplicated: Secondary | ICD-10-CM | POA: Diagnosis present

## 2016-12-25 DIAGNOSIS — O99334 Smoking (tobacco) complicating childbirth: Secondary | ICD-10-CM | POA: Diagnosis present

## 2016-12-25 DIAGNOSIS — O1410 Severe pre-eclampsia, unspecified trimester: Secondary | ICD-10-CM | POA: Diagnosis present

## 2016-12-25 DIAGNOSIS — O0932 Supervision of pregnancy with insufficient antenatal care, second trimester: Secondary | ICD-10-CM

## 2016-12-25 LAB — COMPREHENSIVE METABOLIC PANEL
ALT: 21 U/L (ref 14–54)
AST: 36 U/L (ref 15–41)
Albumin: 2.8 g/dL — ABNORMAL LOW (ref 3.5–5.0)
Alkaline Phosphatase: 268 U/L — ABNORMAL HIGH (ref 38–126)
Anion gap: 12 (ref 5–15)
CHLORIDE: 99 mmol/L — AB (ref 101–111)
CO2: 24 mmol/L (ref 22–32)
CREATININE: 0.6 mg/dL (ref 0.44–1.00)
Calcium: 8.8 mg/dL — ABNORMAL LOW (ref 8.9–10.3)
GFR calc non Af Amer: >60 mL/min (ref >60)
Glucose, Bld: 73 mg/dL (ref 65–99)
Potassium: 3.2 mmol/L — ABNORMAL LOW (ref 3.5–5.1)
SODIUM: 135 mmol/L (ref 135–145)
Total Bilirubin: 1 mg/dL (ref 0.3–1.2)
Total Protein: 6.4 g/dL — ABNORMAL LOW (ref 6.5–8.1)

## 2016-12-25 LAB — PROTEIN / CREATININE RATIO, URINE
Creatinine, Urine: 169 mg/dL
Protein Creatinine Ratio: 0.5 mg/mg{Cre} — ABNORMAL HIGH (ref 0.00–0.15)
TOTAL PROTEIN, URINE: 85 mg/dL

## 2016-12-25 LAB — CBC
HCT: 40.6 % (ref 36.0–46.0)
HEMATOCRIT: 39.1 % (ref 36.0–46.0)
HEMOGLOBIN: 13.6 g/dL (ref 12.0–15.0)
Hemoglobin: 14.4 g/dL (ref 12.0–15.0)
MCH: 32.1 pg (ref 26.0–34.0)
MCH: 31.6 pg (ref 26.0–34.0)
MCHC: 35.5 g/dL (ref 30.0–36.0)
MCHC: 34.8 g/dL (ref 30.0–36.0)
MCV: 90.4 fL (ref 78.0–100.0)
MCV: 90.7 fL (ref 78.0–100.0)
PLATELETS: 202 10*3/uL (ref 150–400)
PLATELETS: 193 10*3/uL (ref 150–400)
RBC: 4.49 MIL/uL (ref 3.87–5.11)
RBC: 4.31 MIL/uL (ref 3.87–5.11)
RDW: 13.2 % (ref 11.5–15.5)
RDW: 13.2 % (ref 11.5–15.5)
WBC: 15.5 10*3/uL — AB (ref 4.0–10.5)
WBC: 16.8 10*3/uL — AB (ref 4.0–10.5)

## 2016-12-25 LAB — TYPE AND SCREEN
ABO/RH(D): O POS
ANTIBODY SCREEN: NEGATIVE

## 2016-12-25 LAB — OB RESULTS CONSOLE GBS: GBS: NEGATIVE

## 2016-12-25 LAB — POCT FERN TEST: POCT Fern Test: POSITIVE

## 2016-12-25 LAB — GROUP B STREP BY PCR: Group B strep by PCR: NEGATIVE

## 2016-12-25 LAB — ABO/RH: ABO/RH(D): O POS

## 2016-12-25 MED ORDER — IBUPROFEN 600 MG PO TABS
600.0000 mg | ORAL_TABLET | Freq: Four times a day (QID) | ORAL | Status: DC
  Administered 2016-12-26 – 2016-12-27 (×6): 600 mg via ORAL
  Filled 2016-12-25 (×7): qty 1

## 2016-12-25 MED ORDER — LIDOCAINE HCL (PF) 1 % IJ SOLN
INTRAMUSCULAR | Status: DC | PRN
  Administered 2016-12-25: 4 mL
  Administered 2016-12-25: 6 mL via EPIDURAL

## 2016-12-25 MED ORDER — OXYCODONE HCL 5 MG PO TABS: 10.0000 mg | ORAL_TABLET | ORAL | Status: DC | PRN

## 2016-12-25 MED ORDER — HYDRALAZINE HCL 20 MG/ML IJ SOLN: 10.0000 mg | Freq: Once | INTRAMUSCULAR | Status: DC | PRN

## 2016-12-25 MED ORDER — FENTANYL CITRATE (PF) 100 MCG/2ML IJ SOLN
100.0000 ug | INTRAMUSCULAR | Status: DC | PRN
  Administered 2016-12-25 (×2): 100 ug via INTRAVENOUS
  Filled 2016-12-25 (×2): qty 2

## 2016-12-25 MED ORDER — ONDANSETRON HCL 4 MG PO TABS: 4.0000 mg | ORAL_TABLET | ORAL | Status: DC | PRN

## 2016-12-25 MED ORDER — OXYTOCIN 10 UNIT/ML IJ SOLN
10.0000 [IU] | Freq: Once | INTRAMUSCULAR | Status: AC | PRN
  Administered 2016-12-25: 10 [IU] via INTRAMUSCULAR
  Filled 2016-12-25: qty 1

## 2016-12-25 MED ORDER — OXYCODONE-ACETAMINOPHEN 5-325 MG PO TABS: 2.0000 | ORAL_TABLET | ORAL | Status: DC | PRN

## 2016-12-25 MED ORDER — SOD CITRATE-CITRIC ACID 500-334 MG/5ML PO SOLN: 30.0000 mL | ORAL | Status: DC | PRN

## 2016-12-25 MED ORDER — PRENATAL MULTIVITAMIN CH
1.0000 | ORAL_TABLET | Freq: Every day | ORAL | Status: DC
  Administered 2016-12-26 – 2016-12-27 (×2): 1 via ORAL
  Filled 2016-12-25 (×2): qty 1

## 2016-12-25 MED ORDER — FENTANYL 2.5 MCG/ML BUPIVACAINE 1/10 % EPIDURAL INFUSION (WH - ANES)
14.0000 mL/h | INTRAMUSCULAR | Status: DC | PRN
  Administered 2016-12-25 (×2): 14 mL/h via EPIDURAL

## 2016-12-25 MED ORDER — LACTATED RINGERS IV SOLN
INTRAVENOUS | Status: DC
  Administered 2016-12-25 – 2016-12-26 (×4): via INTRAVENOUS

## 2016-12-25 MED ORDER — FLEET ENEMA 7-19 GM/118ML RE ENEM: 1.0000 | ENEMA | Freq: Every day | RECTAL | Status: DC | PRN

## 2016-12-25 MED ORDER — TERBUTALINE SULFATE 1 MG/ML IJ SOLN: 0.2500 mg | Freq: Once | INTRAMUSCULAR | Status: DC | PRN

## 2016-12-25 MED ORDER — EPHEDRINE 5 MG/ML INJ: 10.0000 mg | INTRAVENOUS | Status: DC | PRN

## 2016-12-25 MED ORDER — ACETAMINOPHEN 325 MG PO TABS: 650.0000 mg | ORAL_TABLET | ORAL | Status: DC | PRN

## 2016-12-25 MED ORDER — FENTANYL CITRATE (PF) 100 MCG/2ML IJ SOLN: 100.0000 ug | INTRAMUSCULAR | Status: DC | PRN

## 2016-12-25 MED ORDER — ONDANSETRON HCL 4 MG/2ML IJ SOLN: 4.0000 mg | INTRAMUSCULAR | Status: DC | PRN

## 2016-12-25 MED ORDER — WITCH HAZEL-GLYCERIN EX PADS: 1.0000 "application " | MEDICATED_PAD | CUTANEOUS | Status: DC | PRN

## 2016-12-25 MED ORDER — MEASLES, MUMPS & RUBELLA VAC ~~LOC~~ INJ: 0.5000 mL | INJECTION | Freq: Once | SUBCUTANEOUS | Status: DC

## 2016-12-25 MED ORDER — SIMETHICONE 80 MG PO CHEW: 80.0000 mg | CHEWABLE_TABLET | ORAL | Status: DC | PRN

## 2016-12-25 MED ORDER — LIDOCAINE HCL (PF) 1 % IJ SOLN
30.0000 mL | INTRAMUSCULAR | Status: DC | PRN
  Administered 2016-12-25: 30 mL via SUBCUTANEOUS
  Filled 2016-12-25: qty 30

## 2016-12-25 MED ORDER — COCONUT OIL OIL
1.0000 "application " | TOPICAL_OIL | Status: DC | PRN
  Administered 2016-12-26: 1 via TOPICAL
  Filled 2016-12-25: qty 120

## 2016-12-25 MED ORDER — OXYTOCIN BOLUS FROM INFUSION: 500.0000 mL | Freq: Once | INTRAVENOUS | Status: DC

## 2016-12-25 MED ORDER — FENTANYL 2.5 MCG/ML BUPIVACAINE 1/10 % EPIDURAL INFUSION (WH - ANES)
INTRAMUSCULAR | Status: AC
  Administered 2016-12-25: 14 mL/h via EPIDURAL
  Filled 2016-12-25: qty 100

## 2016-12-25 MED ORDER — PHENYLEPHRINE 40 MCG/ML (10ML) SYRINGE FOR IV PUSH (FOR BLOOD PRESSURE SUPPORT): 80.0000 ug | PREFILLED_SYRINGE | INTRAVENOUS | Status: DC | PRN

## 2016-12-25 MED ORDER — OXYCODONE-ACETAMINOPHEN 5-325 MG PO TABS: 1.0000 | ORAL_TABLET | ORAL | Status: DC | PRN

## 2016-12-25 MED ORDER — DIPHENHYDRAMINE HCL 50 MG/ML IJ SOLN: 12.5000 mg | INTRAMUSCULAR | Status: DC | PRN

## 2016-12-25 MED ORDER — METHYLERGONOVINE MALEATE 0.2 MG PO TABS: 0.2000 mg | ORAL_TABLET | ORAL | Status: DC | PRN

## 2016-12-25 MED ORDER — BISACODYL 10 MG RE SUPP
10.0000 mg | Freq: Every day | RECTAL | Status: DC | PRN
  Filled 2016-12-25: qty 1

## 2016-12-25 MED ORDER — OXYCODONE HCL 5 MG PO TABS: 5.0000 mg | ORAL_TABLET | ORAL | Status: DC | PRN

## 2016-12-25 MED ORDER — MAGNESIUM SULFATE 40 G IN LACTATED RINGERS - SIMPLE
2.0000 g/h | INTRAVENOUS | Status: AC
  Administered 2016-12-25 – 2016-12-26 (×2): 2 g/h via INTRAVENOUS
  Filled 2016-12-25 (×2): qty 40

## 2016-12-25 MED ORDER — TETANUS-DIPHTH-ACELL PERTUSSIS 5-2.5-18.5 LF-MCG/0.5 IM SUSP
0.5000 mL | Freq: Once | INTRAMUSCULAR | Status: AC
  Administered 2016-12-26: 0.5 mL via INTRAMUSCULAR
  Filled 2016-12-25: qty 0.5

## 2016-12-25 MED ORDER — LACTATED RINGERS IV SOLN: 500.0000 mL | Freq: Once | INTRAVENOUS | Status: DC

## 2016-12-25 MED ORDER — ZOLPIDEM TARTRATE 5 MG PO TABS: 5.0000 mg | ORAL_TABLET | Freq: Every evening | ORAL | Status: DC | PRN

## 2016-12-25 MED ORDER — OXYTOCIN 40 UNITS IN LACTATED RINGERS INFUSION - SIMPLE MED
2.5000 [IU]/h | INTRAVENOUS | Status: DC
  Administered 2016-12-25: 2.5 [IU]/h via INTRAVENOUS

## 2016-12-25 MED ORDER — MAGNESIUM SULFATE BOLUS VIA INFUSION
4.0000 g | Freq: Once | INTRAVENOUS | Status: AC
  Administered 2016-12-25: 4 g via INTRAVENOUS
  Filled 2016-12-25: qty 500

## 2016-12-25 MED ORDER — FERROUS SULFATE 325 (65 FE) MG PO TABS
325.0000 mg | ORAL_TABLET | Freq: Two times a day (BID) | ORAL | Status: DC
  Administered 2016-12-26 – 2016-12-27 (×3): 325 mg via ORAL
  Filled 2016-12-25 (×3): qty 1

## 2016-12-25 MED ORDER — DIPHENHYDRAMINE HCL 25 MG PO CAPS: 25.0000 mg | ORAL_CAPSULE | Freq: Four times a day (QID) | ORAL | Status: DC | PRN

## 2016-12-25 MED ORDER — METHYLERGONOVINE MALEATE 0.2 MG/ML IJ SOLN: 0.2000 mg | INTRAMUSCULAR | Status: DC | PRN

## 2016-12-25 MED ORDER — FENTANYL CITRATE (PF) 100 MCG/2ML IJ SOLN
INTRAMUSCULAR | Status: AC
  Administered 2016-12-25: 100 ug via INTRAVENOUS
  Filled 2016-12-25: qty 2

## 2016-12-25 MED ORDER — BENZOCAINE-MENTHOL 20-0.5 % EX AERO
1.0000 "application " | INHALATION_SPRAY | CUTANEOUS | Status: DC | PRN
  Administered 2016-12-26: 1 via TOPICAL
  Filled 2016-12-25: qty 56

## 2016-12-25 MED ORDER — OXYTOCIN 40 UNITS IN LACTATED RINGERS INFUSION - SIMPLE MED
1.0000 m[IU]/min | INTRAVENOUS | Status: DC
Start: 2016-12-25 — End: 2016-12-25
  Administered 2016-12-25: 6 m[IU]/min via INTRAVENOUS
  Administered 2016-12-25: 2 m[IU]/min via INTRAVENOUS
  Administered 2016-12-25: 8 m[IU]/min via INTRAVENOUS
  Filled 2016-12-25: qty 1000

## 2016-12-25 MED ORDER — DOCUSATE SODIUM 100 MG PO CAPS
100.0000 mg | ORAL_CAPSULE | Freq: Two times a day (BID) | ORAL | Status: DC
  Administered 2016-12-26 – 2016-12-27 (×3): 100 mg via ORAL
  Filled 2016-12-25 (×3): qty 1

## 2016-12-25 MED ORDER — DIBUCAINE 1 % RE OINT: 1.0000 "application " | TOPICAL_OINTMENT | RECTAL | Status: DC | PRN

## 2016-12-25 MED ORDER — LACTATED RINGERS IV SOLN: 500.0000 mL | INTRAVENOUS | Status: DC | PRN

## 2016-12-25 MED ORDER — LABETALOL HCL 5 MG/ML IV SOLN
20.0000 mg | INTRAVENOUS | Status: DC | PRN
Start: 2016-12-25 — End: 2016-12-27
  Administered 2016-12-25: 20 mg via INTRAVENOUS
  Filled 2016-12-25: qty 4

## 2016-12-25 NOTE — H&P (Signed)
LABOR AND DELIVERY ADMISSION HISTORY AND PHYSICAL NOTE  Laura Wagner is a 21 y.o. female G2P0010 with IUP at [redacted]w[redacted]d by LMP presenting for SROM and painful, frequenty contractions.  She reports positive fetal movement. She endorses leakage of clear fluid but no vaginal bleeding. She has lower abdominal pain with contractions.  She denies headache, blurry vision, seeing spots, difficulty breathing, chest pain, abdominal pain, or lower extermity swelling, urinary burning or pain, no diarrhea or constipation.  She endorses some intermittent nausea but no vomiting  Prenatal History/Complications: PNC at [facility] Pregnancy complications:  - none  Past Medical History: Past Medical History:  Diagnosis Date  . Medical history non-contributory     Past Surgical History: Past Surgical History:  Procedure Laterality Date  . NO PAST SURGERIES      Obstetrical History: OB History    Gravida Para Term Preterm AB Living   2       1     SAB TAB Ectopic Multiple Live Births   1              Social History: Social History   Social History  . Marital status: Single    Spouse name: N/A  . Number of children: N/A  . Years of education: N/A   Social History Main Topics  . Smoking status: Current Some Day Smoker    Packs/day: 0.25    Years: 2.00    Types: Cigarettes  . Smokeless tobacco: Never Used     Comment: smokes 1-2 cigs per day  . Alcohol use No  . Drug use: No  . Sexual activity: Not Currently    Birth control/ protection: None   Other Topics Concern  . None   Social History Narrative  . None    Family History: Family History  Problem Relation Age of Onset  . Cancer Maternal Grandmother        breast    Allergies: No Known Allergies  Prescriptions Prior to Admission  Medication Sig Dispense Refill Last Dose  . Prenatal Vit-Fe Fumarate-FA (PRENATAL VITAMINS) 28-0.8 MG TABS Take 1 tablet by mouth daily. 30 tablet 11 Taking     Review of Systems  All  systems reviewed and negative except as stated in HPI  Physical Exam Blood pressure (!) 155/118, pulse 76, temperature 98 F (36.7 C), temperature source Oral, resp. rate 18, height  (1.499 m), last menstrual period 03/17/2016. General appearance: alert, cooperative and no distress Lungs: clear to auscultation bilaterally Heart: regular rate and rhythm Abdomen: soft, non-tender; bowel sounds normal Extremities: No calf swelling or tenderness Presentation: cephalic  Fetal monitoring: 140bpm, moderate variability, accelerations present, no decelerations. Uterine activity: regular contractions every 2-3 minutes Dilation: 2 Effacement (%): 60 Station: -2 Exam by:: Dorrene German RN  Prenatal labs: ABO, Rh: O/Positive/-- (05/16 1446) Antibody: Negative (08/01 0835) Rubella: 1.64 (05/16 1446) RPR: Non Reactive (08/01 0835)  HBsAg: Negative (05/16 1446)  HIV:   non reactive GC/Chlamydia: negative on 06/25/2016 GBS:   pending 1 hr Glucola: 64 fasting, 169, 147 Genetic screening:  normal Anatomy US: normal  Prenatal Transfer Tool  Maternal Diabetes: No Genetic Screening: Normal Maternal Ultrasounds/Referrals: Normal Fetal Ultrasounds or other Referrals:  None Maternal Substance Abuse:  Yes:  Type: Smoker Significant Maternal Medications:  None Significant Maternal Lab Results: None  Results for orders placed or performed during the hospital encounter of 12/25/16 (from the past 24 hour(s))  POCT fern test   Collection Time: 12/25/16 10:06 AM  Result  Value Ref Range   POCT Fern Test Positive = ruptured amniotic membanes     Patient Active Problem List   Diagnosis Date Noted  . Supervision of normal pregnancy 07/30/2016  . Late prenatal care in second trimester 07/30/2016  . Smoker 07/30/2016    Assessment: Laura Wagner is a 21 y.o. G2P0010 at [redacted]w[redacted]d here for IOL due to SROM and new onset of gHTN. PreE labs are pending. GBS status unknown; pending Group B PCR. Mag bolus and  infusion started.  #Labor: IOL with Pit #Pain: Well controlled, epidural on request. #FWB: Cat I #ID:  none #MOF: breast #MOC: Depo #Circ:  n/a  Arlyce Harman 12/25/2016, 10:11 AM   I confirm that I have verified the information documented in the resident's note and that I have also personally reperformed the physical exam and all medical decision making activities.  [name], CNM  12/25/2016 11:04 AM

## 2016-12-25 NOTE — Anesthesia Procedure Notes (Signed)
Epidural Patient location during procedure: OB  Staffing Anesthesiologist: Quaneshia Wareing  Preanesthetic Checklist Completed: patient identified, pre-op evaluation, timeout performed, IV checked, risks and benefits discussed and monitors and equipment checked  Epidural Patient position: sitting Prep: DuraPrep Patient monitoring: blood pressure and continuous pulse ox Approach: right paramedian Location: L3-L4 Injection technique: LOR air  Needle:  Needle type: Tuohy  Needle gauge: 17 G Needle insertion depth: 6 cm Catheter type: closed end flexible Catheter size: 19 Gauge Catheter at skin depth: 12 cm Test dose: negative  Assessment Sensory level: T8  Additional Notes    Dosing of Epidural:  1st dose, through catheter .............................................  Xylocaine 40 mg  2nd dose, through catheter, after waiting 3 minutes.........Xylocaine 60 mg    As each dose occurred, patient was free of IV sx; and patient exhibited no evidence of SA injection.  Patient is more comfortable after epidural dosed. Please see RN's note for documentation of vital signs,and FHR which are stable.  Patient reminded not to try to ambulate with numb legs, and that an RN must be present when she attempts to get up.         

## 2016-12-25 NOTE — Anesthesia Preprocedure Evaluation (Signed)
Anesthesia Evaluation  Patient identified by MRN, date of birth, ID band Patient awake    Reviewed: Allergy & Precautions, H&P , Patient's Chart, lab work & pertinent test results, reviewed documented beta blocker date and time   Airway Mallampati: II  TM Distance: >3 FB Neck ROM: full    Dental no notable dental hx.    Pulmonary Current Smoker,    Pulmonary exam normal breath sounds clear to auscultation       Cardiovascular  Rhythm:regular Rate:Normal     Neuro/Psych    GI/Hepatic   Endo/Other    Renal/GU      Musculoskeletal   Abdominal   Peds  Hematology   Anesthesia Other Findings   Reproductive/Obstetrics                             Anesthesia Physical  Anesthesia Plan  ASA: II  Anesthesia Plan: Epidural   Post-op Pain Management:    Induction:   PONV Risk Score and Plan:   Airway Management Planned:   Additional Equipment:   Intra-op Plan:   Post-operative Plan:   Informed Consent: I have reviewed the patients History and Physical, chart, labs and discussed the procedure including the risks, benefits and alternatives for the proposed anesthesia with the patient or authorized representative who has indicated his/her understanding and acceptance.   Dental Advisory Given  Plan Discussed with: CRNA and Surgeon  Anesthesia Plan Comments: (Labs checked- platelets confirmed with RN in room. Fetal heart tracing, per RN, reported to be stable enough for sitting procedure. Discussed epidural, and patient consents to the procedure:  included risk of possible headache,backache, failed block, allergic reaction, and nerve injury. This patient was asked if she had any questions or concerns before the procedure started.)        Anesthesia Quick Evaluation  

## 2016-12-25 NOTE — Progress Notes (Signed)
Laura Wagner is a 21 y.o. G2P0010 at [redacted]w[redacted]d by LMP admitted for SROM and labor  Subjective: Patient doing well, some pain with contractions.   Objective: BP (!) 121/109   Pulse 85   Temp 97.7 F (36.5 C) (Oral)   Resp 19   Ht  (1.499 m)   LMP 03/17/2016 (Approximate)   SpO2 98%  No intake/output data recorded. Total I/O In: 1490.9 [P.O.:800; I.V.:690.9] Out: 350 [Urine:350]  FHT:  FHR: 125 bpm, variability: moderate,  accelerations:  Present,  decelerations:  Absent UC:   regular, every 3-4 minutes SVE:   Dilation: 3 Effacement (%): 90 Station: -2 Exam by:: Polos  Labs: Lab Results  Component Value Date   WBC 15.5 (H) 12/25/2016   HGB 14.4 12/25/2016   HCT 40.6 12/25/2016   MCV 90.4 12/25/2016   PLT 202 12/25/2016    Assessment / Plan: Augmentation of labor, progressing well AROM at 1625 hours   Labor: Progressing on pitocin Preeclampsia:  on magnesium sulfate Fetal Wellbeing:  Category I Pain Control:  Epidural I/D:  n/a Anticipated MOD:  NSVD  Dyke Maes Cimolino PA-S 12/25/2016, 4:27 PM   I confirm that I have verified the information documented in the physician assistant student's note and that I have also personally reperformed the physical exam and all medical decision making activities.  [name], CNM 12/25/2016 4:42 PM

## 2016-12-25 NOTE — MAU Note (Signed)
Pt reports her water broke about 6:30 this morning clear fluid.. Having frequent contractions. Good fetal movement reported.

## 2016-12-25 NOTE — Anesthesia Pain Management Evaluation Note (Signed)
  CRNA Pain Management Visit Note  Patient: Laura Wagner, 21 y.o., female  "Hello I am a member of the anesthesia team at [facility]. We have an anesthesia team available at all times to provide care throughout the hospital, including epidural management and anesthesia for C-section. I don't know your plan for the delivery whether it a natural birth, water birth, IV sedation, nitrous supplementation, doula or epidural, but we want to meet your pain goals."   1.Was your pain managed to your expectations on prior hospitalizations?   No prior hospitalizations  2.What is your expectation for pain management during this hospitalization?     Epidural and IV pain meds  3.How can we help you reach that goal? IV pain meds then epidural  Record the patient's initial score and the patient's pain goal.   Pain: 5  Pain Goal: 4 [facility] wants you to be able to say your pain was always managed very well.  [name] 12/25/2016

## 2016-12-26 LAB — RPR: RPR Ser Ql: NONREACTIVE

## 2016-12-26 MED ORDER — POTASSIUM CHLORIDE CRYS ER 20 MEQ PO TBCR
30.0000 meq | EXTENDED_RELEASE_TABLET | Freq: Two times a day (BID) | ORAL | Status: DC
  Administered 2016-12-26 – 2016-12-27 (×3): 30 meq via ORAL
  Filled 2016-12-26 (×5): qty 1

## 2016-12-26 NOTE — Progress Notes (Signed)
Daily Postpartum Note  Admission Date: 12/25/2016 Current Date: 12/26/2016 9:26 AM  Laura Wagner is a 21 y.o. G2P1011 PPD#1 s/p SVD/2nd (EBL 568m) @ 359w6dPt admitted for severe pre-eclampsia (BP and PC ratio) IOL.  Pregnancy complicated by: Patient Active Problem List   Diagnosis Date Noted  . Preeclampsia 12/25/2016  . Supervision of normal pregnancy 07/30/2016  . Late prenatal care in second trimester 07/30/2016  . Smoker 07/30/2016    Overnight/24hr events:  none  Subjective:  No s/s of pre-eclampsia, +decreasing lochia.   Objective:    Current Vital Signs 24h Vital Sign Ranges  T 98.3 F (36.8 C) Temp  Avg: 98 F (36.7 C)  Min: 97.7 F (36.5 C)  Max: 98.3 F (36.8 C)  BP 123/85 BP  Min: 107/79  Max: 162/112  HR 76 Pulse  Avg: 90.8  Min: 62  Max: 194  RR 16 Resp  Avg: 17.6  Min: 16  Max: 20  SaO2 100 % Not Delivered SpO2  Avg: 97.2 %  Min: 94 %  Max: 100 %       24 Hour I/O Current Shift I/O  Time Ins Outs 10/11 0701 - 10/12 0700 In: 3738.4 [P.O.:1610; I.V.:2128.4] Out: 2500 [Urine:2000] 10/12 0701 - 10/12 1900 In: 365 [P.O.:240; I.V.:125] Out: -    UOP:>10057mr  Patient Vitals for the past 24 hrs:  BP Temp Temp src Pulse Resp SpO2 Height Weight  12/26/16 0900 123/85 98.3 F (36.8 C) Oral 76 16 100 % - -  12/26/16 0700 - - - - 16 - - -  12/26/16 0626712- - - 17 - - -  12/26/16 0506 125/80 - - 77 16 - - -  12/26/16 0400 - - - - 16 - - -  12/26/16 0300 - - - - 19 - - -  12/26/16 0232 123/79 - - (!) 110 - - - -  12/26/16 0201 129/88 - - 87 18 - - -  12/26/16 0131 110/62 - - 70 - - - -  12/26/16 0100 (!) 125/93 - - (!) 194 16 - - -  12/26/16 0031 122/78 - - 85 18 - - -  12/25/16 2331 (!) 146/88 - - 62 18 - - -  12/25/16 2301 132/83 - - 91 16 - - -  12/25/16 2231 136/87 - - 83 16 - - -  12/25/16 2216 139/85 - - (!) 104 18 - - -  12/25/16 2201 (!) 148/76 - - (!) 102 16 - - -  12/25/16 2146 114/78 - - 80 18 - - -  12/25/16 2131 120/81 - - 94 - - - -   12/25/16 2116 123/85 - - 81 - - - -  12/25/16 2101 122/82 - - 85 18 - - -  12/25/16 2046 128/90 - - 85 18 - - -  12/25/16 2031 109/73 - - 89 16 - - -  12/25/16 2016 113/74 - - 88 18 - - -  12/25/16 2001 107/79 - - 97 18 - - -  12/25/16 1946 108/68 - - 87 18 - - -  12/25/16 1931 120/83 - - 98 18 - - -  12/25/16 1920 128/88 - - (!) 105 - - - -  12/25/16 1830 (!) 144/92 - - (!) 114 - - - -  12/25/16 1758 - - - - - - '4\' 11"'$  (1.499 m) 135 lb (61.2 kg)  12/25/16 1740 - - - (!) 103 - - - -  12/25/16 1730 - - - (!)Marland Kitchen  112 - - - -  12/25/16 1700 (!) 146/99 - - 97 - - - -  12/25/16 1600 - - - - 19 - - -  12/25/16 1500 120/78 97.7 F (36.5 C) Oral 85 19 - - -  12/25/16 1359 - - - - 20 - - -  12/25/16 1300 (!) 121/109 97.9 F (36.6 C) Oral 85 19 98 % - -  12/25/16 1259 (!) 121/109 - - 85 19 98 % - -  12/25/16 1124 - 98 F (36.7 C) Oral - 18 97 % - -  12/25/16 1120 - - - - - 96 % - -  12/25/16 1115 112/79 - - 78 - 94 % - -  12/25/16 1100 (!) 145/107 - - 78 - - - -  12/25/16 1030 (!) 153/113 - - 82 - - - -  12/25/16 1015 (!) 162/112 - - 83 - - - -  12/25/16 1002 (!) 155/118 - - 76 - - - -  12/25/16 0948 (!) 151/108 - - 81 - - - -  12/25/16 0927 (!) 157/96 98 F (36.7 C) Oral 72 18 - '4\' 11"'$  (1.499 m) -    Physical exam: General: Well nourished, well developed female in no acute distress. Abdomen: gravid nttp Cardiovascular: S1, S2 normal, no murmur, rub or gallop, regular rate and rhythm Respiratory: CTAB Extremities: no clubbing, cyanosis or edema Skin: Warm and dry.   Medications: Current Facility-Administered Medications  Medication Dose Route Frequency Provider Last Rate Last Dose  . acetaminophen (TYLENOL) tablet 650 mg  650 mg Oral Q4H PRN Christin Fudge, CNM      . benzocaine-Menthol (DERMOPLAST) 20-0.5 % topical spray 1 application  1 application Topical PRN Christin Fudge, CNM   1 application at 42/68/34 0820  . bisacodyl (DULCOLAX) suppository 10 mg  10  mg Rectal Daily PRN Christin Fudge, CNM      . coconut oil  1 application Topical PRN Christin Fudge, CNM   1 application at 19/62/22 0820  . witch hazel-glycerin (TUCKS) pad 1 application  1 application Topical PRN Cresenzo-Dishmon, Joaquim Lai, CNM       And  . dibucaine (NUPERCAINAL) 1 % rectal ointment 1 application  1 application Rectal PRN Cresenzo-Dishmon, Joaquim Lai, CNM      . diphenhydrAMINE (BENADRYL) capsule 25 mg  25 mg Oral Q6H PRN Christin Fudge, CNM      . docusate sodium (COLACE) capsule 100 mg  100 mg Oral BID Christin Fudge, CNM      . ferrous sulfate tablet 325 mg  325 mg Oral BID WC Cresenzo-Dishmon, Joaquim Lai, CNM   325 mg at 12/26/16 0820  . hydrALAZINE (APRESOLINE) injection 10 mg  10 mg Intravenous Once PRN Nuala Alpha, DO      . ibuprofen (ADVIL,MOTRIN) tablet 600 mg  600 mg Oral Q6H Cresenzo-Dishmon, Frances, CNM   600 mg at 12/26/16 0607  . labetalol (NORMODYNE,TRANDATE) injection 20-80 mg  20-80 mg Intravenous Q10 min PRN Lockamy, Timothy, DO   20 mg at 12/25/16 1109  . lactated ringers infusion   Intravenous Continuous Laury Deep, CNM 125 mL/hr at 12/26/16 9798    . lidocaine (PF) (XYLOCAINE) 1 % injection 30 mL  30 mL Subcutaneous PRN Laury Deep, CNM   30 mL at 12/25/16 1850  . magnesium sulfate 40 grams in LR 500 mL OB infusion  2 g/hr Intravenous Titrated Laury Deep, CNM 25 mL/hr at 12/26/16 0838 2 g/hr at 12/26/16 9211  . measles, mumps and rubella vaccine (MMR) injection  0.5 mL  0.5 mL Subcutaneous Once Christin Fudge, CNM      . methylergonovine (METHERGINE) tablet 0.2 mg  0.2 mg Oral Q4H PRN Cresenzo-Dishmon, Joaquim Lai, CNM       Or  . methylergonovine (METHERGINE) injection 0.2 mg  0.2 mg Intramuscular Q4H PRN Christin Fudge, CNM      . ondansetron (ZOFRAN) tablet 4 mg  4 mg Oral Q4H PRN Cresenzo-Dishmon, Joaquim Lai, CNM       Or  . ondansetron (ZOFRAN) injection 4 mg  4 mg Intravenous  Q4H PRN Christin Fudge, CNM      . oxyCODONE (Oxy IR/ROXICODONE) immediate release tablet 10 mg  10 mg Oral Q4H PRN Christin Fudge, CNM      . oxyCODONE (Oxy IR/ROXICODONE) immediate release tablet 5 mg  5 mg Oral Q4H PRN Christin Fudge, CNM      . oxytocin (PITOCIN) injection 10 Units  10 Units Intramuscular Once PRN Laury Deep, CNM   10 Units at 12/25/16 1848  . prenatal multivitamin tablet 1 tablet  1 tablet Oral Daily Cresenzo-Dishmon, Joaquim Lai, CNM      . simethicone (MYLICON) chewable tablet 80 mg  80 mg Oral PRN Christin Fudge, CNM      . sodium phosphate (FLEET) 7-19 GM/118ML enema 1 enema  1 enema Rectal Daily PRN Christin Fudge, CNM      . Tdap (BOOSTRIX) injection 0.5 mL  0.5 mL Intramuscular Once Christin Fudge, CNM      . zolpidem (AMBIEN) tablet 5 mg  5 mg Oral QHS PRN Christin Fudge, CNM        Labs:   Recent Labs Lab 12/25/16 0957 12/25/16 1708  WBC 15.5* 16.8*  HGB 14.4 13.6  HCT 40.6 39.1  PLT 202 193     Recent Labs Lab 12/25/16 0957  NA 135  K 3.2*  CL 99*  CO2 24  BUN <5*  CREATININE 0.60  CALCIUM 8.8*  PROT 6.4*  BILITOT 1.0  ALKPHOS 268*  ALT 21  AST 36  GLUCOSE 73    Radiology: no new imaging  Assessment & Plan:  Pt doing well *PP: routine care. O POS. BC not d/w pt. breastfeeding *Severe pre-eclampsia: PP Mg until approx 1900 tonight. 1wk BP check request sent to FT *PPx: SCDs, OOB  *FEN/GI: regular diet, IVF *Dispo: possibly tomorrow.   Durene Romans MD Attending Center for [facility])

## 2016-12-26 NOTE — Lactation Note (Signed)
This note was copied from a baby's chart. Lactation Consultation Note  Patient Name: Girl [place] Premo ZOXWR'U Date: 12/26/2016 Reason for consult: Initial assessment  Baby 16 hours old. Baby sleeping in crib but just starting to stir. Offered to assist mom with latching baby, but mom declined. Mom states that baby is latching without NS now and mom denies any nipple pain. Enc mom to call for assistance with latching as needed. Enc mom to nurse with cues, and discussed cluster feeding. Mom aware of OP/BFSG and LC phone line assistance after D/C. Maternal Data Has patient been taught Hand Expression?: Yes (per mom.)  Feeding Feeding Type: Breast Fed Length of feed: 30 min  LATCH Score                   Interventions    Lactation Tools Discussed/Used WIC Program: No   Consult Status Consult Status: Follow-up Date: 12/27/16 Follow-up type: In-patient    Sherlyn Hay 12/26/2016, 10:59 AM

## 2016-12-26 NOTE — Anesthesia Postprocedure Evaluation (Signed)
Anesthesia Post Note  Patient: Laura Wagner  Procedure(s) Performed: AN AD HOC LABOR EPIDURAL     Patient location during evaluation: [facility] Anesthesia Type: Epidural Level of consciousness: awake and alert Pain management: pain level controlled Vital Signs Assessment: post-procedure vital signs reviewed and stable Respiratory status: spontaneous breathing, nonlabored ventilation and respiratory function stable Cardiovascular status: stable Postop Assessment: no headache, no backache, epidural receding and patient able to bend at knees Anesthetic complications: no    Last Vitals:  Vitals:   12/26/16 0620 12/26/16 0700  BP:    Pulse:    Resp: 17 16  Temp:    SpO2:      Last Pain:  Vitals:   12/26/16 0506  TempSrc:   PainSc: 0-No pain   Pain Goal: Patients Stated Pain Goal: Other (Comment) (pt states "I am okay now") (12/25/16 1124)               Rica Records

## 2016-12-27 MED ORDER — IBUPROFEN 600 MG PO TABS: 600.0000 mg | ORAL_TABLET | Freq: Four times a day (QID) | ORAL | 0 refills | Status: DC

## 2016-12-27 NOTE — Lactation Note (Signed)
This note was copied from a baby's chart. Lactation Consultation Note  Patient Name: Laura Wagner ZOXWR'U Date: 12/27/2016 Reason for consult: Follow-up assessment   Follow up with mom of 36 hour old infant. infant with 5 BF for 15-30 minutes, 1 formula feed of 20 cc, 4 voids and 4 stools in the last 24 hours. RN reported infant was very fussy during the night and would not latch and they were unable to hand express colostrum, infant was sent to nursery for hearing screen and was given a bottle of formula. Mom said she did not know if infant received formula.  Mom has a bottle of 5 cc EBM that was pumped during the night sitting on the bedside table. Enc mom to give to infant when she finishes this BF.   Mom was BF infant when I entered room in the cradle hold to the right breast while leaning over infant. Mom was not using any pillow support. Enc mom to use pillow support with feedings and to pull infant to breast and keep her close to breast. Infant came off breast and mom was burping her. Went to get mom [employer] and returned. Mom was attempting to latch infant to the left breast in the cradle hold. She was sitting up and had better support of infant. Infant was fussy and was not able to grasp breast. Showed mom the cross cradle hold and infant latched with head support. Infant was actively feeding with intermittent swallows with feeding. Infant got sleepy pretty quickly, enc mom to stimulate infant with feedings and to massage/compress breast with feedings to maintain active suckling. Infant swallows and suckling increased with compression of breast and infant stimulation. Mom with soft compressible breasts with short shaft everted nipples. Mom denied nipple pain with feeding.  Infant was [place] feeding when LC left room.   Enc mom to feed infant STS 8-12 x in 24 hours at first feeding cues. Enc mom to keep infant awake while feeding. Reviewed I/O, signs of dehydration in the infant,  engorgement prevention/treatment, and breast milk handling and storage. Mom is a [facility] client in [facility] and is aware to call to make appt with them. Mom has a [employer]in[employer] pump for home use. Mom reports she has no questions/concerns at this time. LC Brochure and BF Resources Handout given, mom informed of IP/OP Services, BF Support Groups and LC phone #. Enc mom to call with any questions/concerns prn.       Maternal Data Formula Feeding for Exclusion: No Has patient been taught Hand Expression?: Yes Does the patient have breastfeeding experience prior to this delivery?: No  Feeding Feeding Type: Breast Fed Length of feed: 20 min  LATCH Score Latch: Grasps breast easily, tongue down, lips flanged, rhythmical sucking.  Audible Swallowing: Spontaneous and intermittent  Type of Nipple: Everted at rest and after stimulation  Comfort (Breast/Nipple): Soft / non-tender  Hold (Positioning): Assistance needed to correctly position infant at breast and maintain latch.  LATCH Score: 9  Interventions Interventions: Breast feeding basics reviewed;Support pillows;Assisted with latch;Position options;Skin to skin;Expressed milk;Hand express;Breast compression  Lactation Tools Discussed/Used WIC Program: Yes   Consult Status Consult Status: Complete Follow-up type: Call as needed    Ed Blalock 12/27/2016, 8:19 AM

## 2016-12-27 NOTE — Discharge Summary (Signed)
   Physician Discharge Summary  Patient ID: Laura Wagner MRN: [id number] DOB/AGE: 08-Mar-1996 20 y.o.  Admit date: 12/25/2016 Discharge date: 12/27/2016   Discharge Diagnoses:  Active Problems:   Supervision of normal pregnancy   Late prenatal care in second trimester   Smoker   Preeclampsia   Hospital Course: Please see HPI dated 12/25/2016 for details. This is a 21 y.o. G2P1011 now PPD#2 from a spontaneous vaginal delivery after induction of labor for pre-eclampsia with severe features. Her antepartum course was complicated by late prenatal care, smoking, pre-ecclampsia with severe features. Her post partum course was otherwise uncomplicated and she remained on MgSO4 x 24 hrs after delivery. By day 2, she was ambulating, passing flatus and pain was well controlled. BP well controlled with no medication. She was discharged home PPD#2 in good condition. Instructions for follow up given.    Physical exam  Vitals:   12/26/16 1620 12/26/16 2045 12/26/16 2230 12/27/16 0025  BP: 137/90 129/82 125/61 (!) 141/93  Pulse: 79 77 84 80  Resp: Temp: 98 F (36.7 C) 98.4 F (36.9 C)  97.8 F (36.6 C)  TempSrc: Oral Oral    SpO2: 100%  99% 100%  Weight:      Height:       General: alert, cooperative and no distress Lochia: appropriate Uterine Fundus: firm Incision: N/A DVT Evaluation: No cords or calf tenderness. No significant calf/ankle edema. Labs: Lab Results  Component Value Date   WBC 16.8 (H) 12/25/2016   HGB 13.6 12/25/2016   HCT 39.1 12/25/2016   MCV 90.7 12/25/2016   PLT 193 12/25/2016   CMP Latest Ref Rng & Units 12/25/2016  Glucose 65 - 99 mg/dL 73  BUN 6 - 20 mg/dL <7(W)  Creatinine 2.95 - 1.00 mg/dL 6.21  Sodium 308 - 657 mmol/L 135  Potassium 3.5 - 5.1 mmol/L 3.2(L)  Chloride 101 - 111 mmol/L 99(L)  CO2 22 - 32 mmol/L 24  Calcium 8.9 - 10.3 mg/dL 8.4(O)  Total Protein 6.5 - 8.1 g/dL 6.4(L)  Total Bilirubin 0.3 - 1.2 mg/dL 1.0  Alkaline Phos 38 -  126 U/L 268(H)  AST 15 - 41 U/L 36  ALT 14 - 54 U/L 21     Consults: None  Tdap: received   Disposition: 01-Home or Self Care  Discharged Condition: good      Follow-up Information    [facility]. Call in 1 day(s).   Specialty:  Obstetrics and Gynecology Why:  schedule blood pressure check for Tues/Wed if the office does not call you Call to schedule 4 week post partum visit. Contact information: [address] [place] [address] [phone]          Signed: Conan Bowens 12/27/2016, 7:53 AM

## 2016-12-27 NOTE — Progress Notes (Signed)
Pt discharged with printed instructions. Pt verbalized an understanding. No concerns noted. Sherese Heyward L Tiago Humphrey, RN 

## 2016-12-27 NOTE — Progress Notes (Signed)
Dr. Earlene Plater contacted in regards of BP of 145/100. No new orders given. Carmelina Dane, RN

## 2017-01-05 ENCOUNTER — Ambulatory Visit (INDEPENDENT_AMBULATORY_CARE_PROVIDER_SITE_OTHER): Payer: Medicaid Other | Admitting: Women's Health

## 2017-01-05 ENCOUNTER — Encounter: Payer: Self-pay | Admitting: Women's Health

## 2017-01-05 VITALS — BP 170/110 | HR 73 | Ht 59.0 in | Wt 113.0 lb

## 2017-01-05 DIAGNOSIS — O165 Unspecified maternal hypertension, complicating the puerperium: Secondary | ICD-10-CM | POA: Diagnosis not present

## 2017-01-05 DIAGNOSIS — O1410 Severe pre-eclampsia, unspecified trimester: Secondary | ICD-10-CM

## 2017-01-05 DIAGNOSIS — I10 Essential (primary) hypertension: Secondary | ICD-10-CM | POA: Insufficient documentation

## 2017-01-05 MED ORDER — AMLODIPINE BESYLATE 10 MG PO TABS: 10.0000 mg | ORAL_TABLET | Freq: Every day | ORAL | 1 refills | Status: DC

## 2017-01-05 NOTE — Progress Notes (Signed)
   GYN VISIT Patient name: Laura Wagner MRN [id number]  Date of birth: 01/12/1996 Chief Complaint:   Blood Pressure Check  History of Present Illness:   Laura Wagner is a 21 y.o. 522P1011 Caucasian female 11d s/p SVB, severe pre-e dx on admission, being seen today for bp check. Was dx w/ severe pre-e on admission in labor, received intrapartum and 24hr pp mag, was not d/c'd on bp meds. Bottlefeeding Denies ha, visual changes, ruq/epigastric pain, n/v.   No problems w/ swelling.   Patient's last menstrual period was 03/17/2016 (approximate). The current method of family planning is abstinence and plans depo Review of Systems:   Pertinent items are noted in HPI Denies fever/chills, dizziness, headaches, visual disturbances, fatigue, shortness of breath, chest pain, abdominal pain, vomiting, abnormal vaginal discharge/itching/odor/irritation, problems with periods, bowel movements, urination, or intercourse unless otherwise stated above.  Pertinent History Reviewed:  Reviewed past medical,surgical, social, obstetrical and family history.  Reviewed problem list, medications and allergies. Physical Assessment:   Vitals:   01/05/17 1520  BP: (!) 170/110  Pulse: 73  Weight: 113 lb (51.3 kg)  Height: 4\' 11"  (1.499 m)  Body mass index is 22.82 kg/m.       Physical Examination:   General appearance: alert, well appearing, and in no distress  Mental status: alert, oriented to person, place, and time  Skin: warm & dry   Cardiovascular: normal heart rate noted   Respiratory: normal respiratory effort, no distress   Abdomen: soft, non-tender  Pelvic: examination not indicated  Extremities: no edema, DTRs 2+, no clonus  No results found for this or any previous visit (from the past 24 hour(s)).  Assessment & Plan:  1) 11d s/p SVB, dx w/ severe pre-e on admission, received intrapartum & 24hr pp mag  2) Postpartum HTN> severe-range bp, asymptomatic, discussed w/ [facility]-no need for re-admit to  hospital, rx norvasc 10mg  daily, see back on Friday for bp check. Discussed pre-e s/s, reasons to seek care  3) Bottlefeeding  Return in about 4 days (around 01/09/2017) for F/U.  Marge DuncansBooker, Alyson Ki Randall CNM, [facility] HospitalWHNP-BC 01/05/2017 4:05 PM

## 2017-01-05 NOTE — Patient Instructions (Addendum)
NO SEX UNTIL AFTER YOU GET YOUR BIRTH CONTROL    Call the office [phone]) or go to [facility] hospital for these signs of pre-eclampsia:  Severe headache that does not go away with Tylenol  Visual changes- seeing spots, double, blurred vision  Pain under your right breast or upper abdomen that does not go away with Tums or heartburn medicine  Nausea and/or vomiting  Severe swelling in your hands, feet, and face     Postpartum Hypertension Postpartum hypertension is high blood pressure after pregnancy that remains higher than normal for more than two days after delivery. You may not realize that you have postpartum hypertension if your blood pressure is not being checked regularly. In some cases, postpartum hypertension will go away on its own, usually within a week of delivery. However, for some women, medical treatment is required to prevent serious complications, such as seizures or stroke. The following things can affect your blood pressure:  The type of delivery you had.  Having received IV fluids or other medicines during or after delivery.  What are the causes? Postpartum hypertension may be caused by any of the following or by a combination of any of the following:  Hypertension that existed before pregnancy (chronic hypertension).  Gestational hypertension.  Preeclampsia or eclampsia.  Receiving a lot of fluid through an IV during or after delivery.  Medicines.  HELLP syndrome.  Hyperthyroidism.  Stroke.  Other rare neurological or blood disorders.  In some cases, the cause may not be known. What increases the risk? Postpartum hypertension can be related to one or more risk factors, such as:  Chronic hypertension. In some cases, this may not have been diagnosed before pregnancy.  Obesity.  Type 2 diabetes.  Kidney disease.  Family history of preeclampsia.  Other medical conditions that cause hormonal imbalances.  What are the signs or symptoms? As  with all types of hypertension, postpartum hypertension may not have any symptoms. Depending on how high your blood pressure is, you may experience:  Headaches. These may be mild, moderate, or severe. They may also be steady, constant, or sudden in onset (thunderclap headache).  Visual changes.  Dizziness.  Shortness of breath.  Swelling of your hands, feet, lower legs, or face. In some cases, you may have swelling in more than one of these locations.  Heart palpitations or a racing heartbeat.  Difficulty breathing while lying down.  Decreased urination.  Other rare signs and symptoms may include:  Sweating more than usual. This lasts longer than a few days after delivery.  Chest pain.  Sudden dizziness when you get up from sitting or lying down.  Seizures.  Nausea or vomiting.  Abdominal pain.  How is this diagnosed? The diagnosis of postpartum hypertension is made through a combination of physical examination findings and testing of your blood and urine. You may also have additional tests, such as a CT scan or an MRI, to check for other complications of postpartum hypertension. How is this treated? When blood pressure is high enough to require treatment, your options may include:  Medicines to reduce blood pressure (antihypertensives). Tell your health care provider if you are breastfeeding or if you plan to breastfeed. There are many antihypertensive medicines that are safe to take while breastfeeding.  Stopping medicines that may be causing hypertension.  Treating medical conditions that are causing hypertension.  Treating the complications of hypertension, such as seizures, stroke, or kidney problems.  Your health care provider will also continue to monitor your blood  pressure closely and repeatedly until it is within a safe range for you. Follow these instructions at home:  Take medicines only as directed by your health care provider.  Get regular exercise  after your health care provider tells you that it is safe.  Follow your health care provider's recommendations on fluid and salt restrictions.  Do not use any tobacco products, including cigarettes, chewing tobacco, or electronic cigarettes. If you need help quitting, ask your health care provider.  Keep all follow-up visits as directed by your health care provider. This is important. Contact a health care provider if:  Your symptoms get worse.  You have new symptoms, such as: ? Headache. ? Dizziness. ? Visual changes. Get help right away if:  You develop a severe or sudden headache.  You have seizures.  You develop numbness or weakness on one side of your body.  You have difficulty thinking, speaking, or swallowing.  You develop severe abdominal pain.  You develop difficulty breathing, chest pain, a racing heartbeat, or heart palpitations. These symptoms may represent a serious problem that is an emergency. Do not wait to see if the symptoms will go away. Get medical help right away. Call your local emergency services (911 in the [place] Do not drive yourself to the hospital. This information is not intended to replace advice given to you by your health care provider. Make sure you discuss any questions you have with your health care provider. Document Released: 11/04/2013 Document Revised: 08/06/2015 Document Reviewed: 09/15/2013 Elsevier Interactive Patient Education  [employer]2018 Elsevier Inc.

## 2017-01-09 ENCOUNTER — Ambulatory Visit: Payer: Medicaid Other | Admitting: Women's Health

## 2017-02-02 ENCOUNTER — Other Ambulatory Visit: Payer: Self-pay

## 2017-02-02 ENCOUNTER — Encounter: Payer: Self-pay | Admitting: Women's Health

## 2017-02-02 ENCOUNTER — Ambulatory Visit (INDEPENDENT_AMBULATORY_CARE_PROVIDER_SITE_OTHER): Payer: Medicaid Other | Admitting: Women's Health

## 2017-02-02 DIAGNOSIS — O165 Unspecified maternal hypertension, complicating the puerperium: Secondary | ICD-10-CM

## 2017-02-02 DIAGNOSIS — Z3202 Encounter for pregnancy test, result negative: Secondary | ICD-10-CM | POA: Diagnosis not present

## 2017-02-02 LAB — POCT URINE PREGNANCY: Preg Test, Ur: NEGATIVE

## 2017-02-02 MED ORDER — MEDROXYPROGESTERONE ACETATE 150 MG/ML IM SUSP: 150.0000 mg | INTRAMUSCULAR | 3 refills | Status: DC

## 2017-02-02 NOTE — Progress Notes (Signed)
POSTPARTUM VISIT Patient name: Laura Wagner MRN [id number]  Date of birth: [birth year] Chief Complaint:   Postpartum Care  History of Present Illness:   Laura Wagner is a 21 y.o. 242P1011 Caucasian female being seen today for a postpartum visit. She is 5 weeks postpartum following a spontaneous vaginal delivery at 38.6 gestational weeks, went in for r/o labor, found to have new-onset severe pre-e and was kept for IOL. Anesthesia: epidural. I have fully reviewed the prenatal and intrapartum course. Pregnancy uncomplicated, severe pre-e was dx on admission, she was seen 11d pp and started on norvasc 10mg  daily, she was supposed to return 4d later for bp recheck, but never showed. States she is Frankl taking norvasc 10mg .  Postpartum course has been complicated by PP HTN. Bleeding no bleeding. Bowel function is normal. Bladder function is normal.  Patient is sexually active. Last sexual activity: last night.  Contraception method is condoms and wants depo.  [place] Postpartum Depression Screening: negative. Score 2.   Last pap 07/30/16.  Results were normal .  Patient's last menstrual period was 01/19/2017 (approximate).  Baby's course has been uncomplicated. Baby is feeding by bottle.  Review of Systems:   Pertinent items are noted in HPI Denies Abnormal vaginal discharge w/ itching/odor/irritation, headaches, visual changes, shortness of breath, chest pain, abdominal pain, severe nausea/vomiting, or problems with urination or bowel movements. Pertinent History Reviewed:  Reviewed past medical,surgical, obstetrical and family history.  Reviewed problem list, medications and allergies. OB History  Gravida Para Term Preterm AB Living  2 1 1   1 1   SAB TAB Ectopic Multiple Live Births  1     0 1    # Outcome Date GA Lbr Len/2nd Weight Sex Delivery Anes PTL Lv  2 Term 12/25/16 [id number] 11:53 / 00:22 6 lb 11.9 oz (3.059 kg) F Vag-Spont EPI, Local  LIV  1 SAB 2017             Physical  Assessment:   Vitals:   02/02/17 1521  BP: 112/70  Pulse: 80  Weight: 115 lb (52.2 kg)  Height: 4\' 11"  (1.499 m)  Body mass index is 23.23 kg/m.       Physical Examination:   General appearance: alert, well appearing, and in no distress  Mental status: alert, oriented to person, place, and time  Skin: warm & dry   Cardiovascular: normal heart rate noted   Respiratory: normal respiratory effort, no distress   Breasts: deferred, no complaints   Abdomen: soft, non-tender   Pelvic: VULVA: normal appearing vulva with no masses, tenderness or lesions, UTERUS: uterus is normal size, shape, consistency and nontender  Rectal: small non-thrombosed hemorrhoids  Extremities: No edema       Results for orders placed or performed in visit on 02/02/17 (from the past 24 hour(s))  POCT urine pregnancy   Collection Time: 02/02/17  3:28 PM  Result Value Ref Range   Preg Test, Ur Negative Negative    Assessment & Plan:  1) Postpartum exam 2) 5 wks s/p SVB after IOL for severe pre-e 3) PP HTN, [place] on norvasc 10mg > stop taking 2d before depo visit, will check bp 4) Bottlefeeding 5) Depression screening 6) Contraception counseling, pt prefers depo, rx sent, she is to return 12/3 for UPT/Depo injection- abstinence until after injection, then condoms x 2wks  Follow-up: Return in about 2 weeks (around 02/16/2017) for depo and bp check.   Orders Placed This Encounter  Procedures  . POCT urine pregnancy  Marge DuncansBooker, Ashonte Angelucci Randall CNM, [facility] IncWHNP-BC 02/02/2017 5:22 PM

## 2017-02-02 NOTE — Patient Instructions (Signed)
Stop taking your blood pressure medicine 2 days before you come for your depo shot  NO SEX UNTIL AFTER YOU GET YOUR BIRTH CONTROL   Medroxyprogesterone injection [Contraceptive] What is this medicine? MEDROXYPROGESTERONE (me DROX ee proe JES te rone) contraceptive injections prevent pregnancy. They provide effective birth control for 3 months. Depo-subQ Provera 104 is also used for treating pain related to endometriosis. This medicine may be used for other purposes; ask your health care provider or pharmacist if you have questions. COMMON BRAND NAME(S): Depo-Provera, Depo-subQ Provera 104 What should I tell my health care provider before I take this medicine? They need to know if you have any of these conditions: -frequently drink alcohol -asthma -blood vessel disease or a history of a blood clot in the lungs or legs -bone disease such as osteoporosis -breast cancer -diabetes -eating disorder (anorexia nervosa or bulimia) -high blood pressure -HIV infection or AIDS -kidney disease -liver disease -mental depression -migraine -seizures (convulsions) -stroke -tobacco smoker -vaginal bleeding -an unusual or allergic reaction to medroxyprogesterone, other hormones, medicines, foods, dyes, or preservatives -pregnant or trying to get pregnant -breast-feeding How should I use this medicine? Depo-Provera Contraceptive injection is given into a muscle. Depo-subQ Provera 104 injection is given under the skin. These injections are given by a health care professional. You must not be pregnant before getting an injection. The injection is usually given during the first 5 days after the start of a menstrual period or 6 weeks after delivery of a baby. Talk to your pediatrician regarding the use of this medicine in children. Special care may be needed. These injections have been used in female children who have started having menstrual periods. Overdosage: If you think you have taken too much of  this medicine contact a poison control center or emergency room at once. NOTE: This medicine is only for you. Do not share this medicine with others. What if I miss a dose? Try not to miss a dose. You must get an injection once every 3 months to maintain birth control. If you cannot keep an appointment, call and reschedule it. If you wait longer than 13 weeks between Depo-Provera contraceptive injections or longer than 14 weeks between Depo-subQ Provera 104 injections, you could get pregnant. Use another method for birth control if you miss your appointment. You may also need a pregnancy test before receiving another injection. What may interact with this medicine? Do not take this medicine with any of the following medications: -bosentan This medicine may also interact with the following medications: -aminoglutethimide -antibiotics or medicines for infections, especially rifampin, rifabutin, rifapentine, and griseofulvin -aprepitant -barbiturate medicines such as phenobarbital or primidone -bexarotene -carbamazepine -medicines for seizures like ethotoin, felbamate, oxcarbazepine, phenytoin, topiramate -modafinil -St. John's wort This list may not describe all possible interactions. Give your health care provider a list of all the medicines, herbs, non-prescription drugs, or dietary supplements you use. Also tell them if you smoke, drink alcohol, or use illegal drugs. Some items may interact with your medicine. What should I watch for while using this medicine? This drug does not protect you against HIV infection (AIDS) or other sexually transmitted diseases. Use of this product may cause you to lose calcium from your bones. Loss of calcium may cause weak bones (osteoporosis). Only use this product for more than 2 years if other forms of birth control are not right for you. The longer you use this product for birth control the more likely you will be at risk for weak bones.  Ask your health care  professional how you can keep strong bones. You may have a change in bleeding pattern or irregular periods. Many females stop having periods while taking this drug. If you have received your injections on time, your chance of being pregnant is very low. If you think you may be pregnant, see your health care professional as soon as possible. Tell your health care professional if you want to get pregnant within the next year. The effect of this medicine may last a long time after you get your last injection. What side effects may I notice from receiving this medicine? Side effects that you should report to your doctor or health care professional as soon as possible: -allergic reactions like skin rash, itching or hives, swelling of the face, lips, or tongue -breast tenderness or discharge -breathing problems -changes in vision -depression -feeling faint or lightheaded, falls -fever -pain in the abdomen, chest, groin, or leg -problems with balance, talking, walking -unusually weak or tired -yellowing of the eyes or skin Side effects that usually do not require medical attention (report to your doctor or health care professional if they continue or are bothersome): -acne -fluid retention and swelling -headache -irregular periods, spotting, or absent periods -temporary pain, itching, or skin reaction at site where injected -weight gain This list may not describe all possible side effects. Call your doctor for medical advice about side effects. You may report side effects to FDA at 1-800-FDA-1088. Where should I keep my medicine? This does not apply. The injection will be given to you by a health care professional. NOTE: This sheet is a summary. It may not cover all possible information. If you have questions about this medicine, talk to your doctor, pharmacist, or health care provider.  2018 Elsevier/Gold Standard (2008-03-24 18:37:56)

## 2017-02-17 ENCOUNTER — Ambulatory Visit (INDEPENDENT_AMBULATORY_CARE_PROVIDER_SITE_OTHER): Payer: Medicaid Other

## 2017-02-17 VITALS — Wt 115.4 lb

## 2017-02-17 DIAGNOSIS — Z3042 Encounter for surveillance of injectable contraceptive: Secondary | ICD-10-CM | POA: Diagnosis not present

## 2017-02-17 DIAGNOSIS — Z3202 Encounter for pregnancy test, result negative: Secondary | ICD-10-CM | POA: Diagnosis not present

## 2017-02-17 LAB — POCT URINE PREGNANCY: Preg Test, Ur: NEGATIVE

## 2017-02-17 MED ORDER — MEDROXYPROGESTERONE ACETATE 150 MG/ML IM SUSP
150.0000 mg | Freq: Once | INTRAMUSCULAR | Status: AC
  Administered 2017-02-17: 150 mg via INTRAMUSCULAR

## 2017-02-17 NOTE — Progress Notes (Signed)
PT here for 1st depo shot 150 mg IM given rt deltoid. Tolerated well. Return 12 weeks for next shot.pad CMA

## 2017-05-12 ENCOUNTER — Ambulatory Visit: Payer: Medicaid Other

## 2017-05-12 ENCOUNTER — Encounter: Payer: Self-pay | Admitting: Obstetrics & Gynecology

## 2018-03-23 ENCOUNTER — Encounter (HOSPITAL_COMMUNITY): Payer: Self-pay

## 2018-03-23 ENCOUNTER — Emergency Department (HOSPITAL_COMMUNITY): Payer: Self-pay

## 2018-03-23 ENCOUNTER — Other Ambulatory Visit: Payer: Self-pay

## 2018-03-23 ENCOUNTER — Observation Stay (HOSPITAL_COMMUNITY)
Admission: EM | Admit: 2018-03-23 | Discharge: 2018-03-25 | Disposition: A | Discharge status: Other Acute Inpatient Hospital | Payer: Self-pay | Attending: Internal Medicine | Admitting: Internal Medicine

## 2018-03-23 DIAGNOSIS — F1520 Other stimulant dependence, uncomplicated: Secondary | ICD-10-CM | POA: Insufficient documentation

## 2018-03-23 DIAGNOSIS — Z793 Long term (current) use of hormonal contraceptives: Secondary | ICD-10-CM | POA: Insufficient documentation

## 2018-03-23 DIAGNOSIS — F17210 Nicotine dependence, cigarettes, uncomplicated: Secondary | ICD-10-CM | POA: Insufficient documentation

## 2018-03-23 DIAGNOSIS — Z915 Personal history of self-harm: Secondary | ICD-10-CM | POA: Insufficient documentation

## 2018-03-23 DIAGNOSIS — F1910 Other psychoactive substance abuse, uncomplicated: Secondary | ICD-10-CM | POA: Diagnosis present

## 2018-03-23 DIAGNOSIS — R4182 Altered mental status, unspecified: Secondary | ICD-10-CM | POA: Diagnosis present

## 2018-03-23 DIAGNOSIS — F329 Major depressive disorder, single episode, unspecified: Secondary | ICD-10-CM | POA: Insufficient documentation

## 2018-03-23 DIAGNOSIS — G9340 Encephalopathy, unspecified: Principal | ICD-10-CM | POA: Insufficient documentation

## 2018-03-23 DIAGNOSIS — F322 Major depressive disorder, single episode, severe without psychotic features: Secondary | ICD-10-CM | POA: Insufficient documentation

## 2018-03-23 DIAGNOSIS — I10 Essential (primary) hypertension: Secondary | ICD-10-CM | POA: Insufficient documentation

## 2018-03-23 DIAGNOSIS — Z791 Long term (current) use of non-steroidal anti-inflammatories (NSAID): Secondary | ICD-10-CM | POA: Insufficient documentation

## 2018-03-23 DIAGNOSIS — D649 Anemia, unspecified: Secondary | ICD-10-CM | POA: Insufficient documentation

## 2018-03-23 DIAGNOSIS — T68XXXA Hypothermia, initial encounter: Secondary | ICD-10-CM | POA: Diagnosis present

## 2018-03-23 DIAGNOSIS — Z79899 Other long term (current) drug therapy: Secondary | ICD-10-CM | POA: Insufficient documentation

## 2018-03-23 DIAGNOSIS — R000 Tachycardia, unspecified: Secondary | ICD-10-CM | POA: Insufficient documentation

## 2018-03-23 LAB — COMPREHENSIVE METABOLIC PANEL
ALT: 12 U/L (ref 0–44)
ANION GAP: 6 (ref 5–15)
AST: 17 U/L (ref 15–41)
Albumin: 4.1 g/dL (ref 3.5–5.0)
Alkaline Phosphatase: 64 U/L (ref 38–126)
BILIRUBIN TOTAL: 0.6 mg/dL (ref 0.3–1.2)
BUN: 10 mg/dL (ref 6–20)
CO2: 24 mmol/L (ref 22–32)
Calcium: 8.7 mg/dL — ABNORMAL LOW (ref 8.9–10.3)
Chloride: 107 mmol/L (ref 98–111)
Creatinine, Ser: 0.5 mg/dL (ref 0.44–1.00)
Glucose, Bld: 89 mg/dL (ref 70–99)
POTASSIUM: 3.9 mmol/L (ref 3.5–5.1)
SODIUM: 137 mmol/L (ref 135–145)
TOTAL PROTEIN: 7 g/dL (ref 6.5–8.1)

## 2018-03-23 LAB — CBC WITH DIFFERENTIAL/PLATELET
ABS IMMATURE GRANULOCYTES: 0.02 10*3/uL (ref 0.00–0.07)
BASOS PCT: 0 %
Basophils Absolute: 0 10*3/uL (ref 0.0–0.1)
Eosinophils Absolute: 0.1 10*3/uL (ref 0.0–0.5)
Eosinophils Relative: 1 %
HCT: 42.8 % (ref 36.0–46.0)
Hemoglobin: 13.9 g/dL (ref 12.0–15.0)
IMMATURE GRANULOCYTES: 0 %
Lymphocytes Relative: 27 %
Lymphs Abs: 2 10*3/uL (ref 0.7–4.0)
MCH: 29.6 pg (ref 26.0–34.0)
MCHC: 32.5 g/dL (ref 30.0–36.0)
MCV: 91.3 fL (ref 80.0–100.0)
MONO ABS: 0.3 10*3/uL (ref 0.1–1.0)
Monocytes Relative: 5 %
NEUTROS ABS: 4.9 10*3/uL (ref 1.7–7.7)
NEUTROS PCT: 67 %
NRBC: 0 % (ref 0.0–0.2)
Platelets: 367 10*3/uL (ref 150–400)
RBC: 4.69 MIL/uL (ref 3.87–5.11)
RDW: 12.2 % (ref 11.5–15.5)
WBC: 7.3 10*3/uL (ref 4.0–10.5)

## 2018-03-23 LAB — RAPID URINE DRUG SCREEN, HOSP PERFORMED
Amphetamines: POSITIVE — AB
BENZODIAZEPINES: POSITIVE — AB
Barbiturates: NOT DETECTED
Cocaine: POSITIVE — AB
Opiates: NOT DETECTED
Tetrahydrocannabinol: NOT DETECTED

## 2018-03-23 LAB — BLOOD GAS, VENOUS
Acid-Base Excess: 2.5 mmol/L — ABNORMAL HIGH (ref 0.0–2.0)
Bicarbonate: 26.2 mmol/L (ref 20.0–28.0)
FIO2: 0.21
O2 Saturation: 96.2 %
Patient temperature: 33.8
pCO2, Ven: 44.6 mmHg (ref 44.0–60.0)
pH, Ven: 7.398 (ref 7.250–7.430)
pO2, Ven: 88.9 mmHg — ABNORMAL HIGH (ref 32.0–45.0)

## 2018-03-23 LAB — URINALYSIS, ROUTINE W REFLEX MICROSCOPIC
Bilirubin Urine: NEGATIVE
GLUCOSE, UA: NEGATIVE mg/dL
Hgb urine dipstick: NEGATIVE
Ketones, ur: NEGATIVE mg/dL
LEUKOCYTES UA: NEGATIVE
Nitrite: NEGATIVE
Protein, ur: NEGATIVE mg/dL
Specific Gravity, Urine: 1.021 (ref 1.005–1.030)
pH: 7 (ref 5.0–8.0)

## 2018-03-23 LAB — AMMONIA: Ammonia: 21 umol/L (ref 9–35)

## 2018-03-23 LAB — ACETAMINOPHEN LEVEL: Acetaminophen (Tylenol), Serum: <10 ug/mL — ABNORMAL LOW (ref 10–30)

## 2018-03-23 LAB — SALICYLATE LEVEL: Salicylate Lvl: <7 mg/dL (ref 2.8–30.0)

## 2018-03-23 LAB — ETHANOL: Alcohol, Ethyl (B): <10 mg/dL (ref <10)

## 2018-03-23 MED ORDER — NALOXONE HCL 2 MG/2ML IJ SOSY
PREFILLED_SYRINGE | INTRAMUSCULAR | Status: AC
  Filled 2018-03-23: qty 2

## 2018-03-23 MED ORDER — LORAZEPAM 2 MG/ML IJ SOLN
INTRAMUSCULAR | Status: AC
  Administered 2018-03-23: 2 mg
  Filled 2018-03-23: qty 1

## 2018-03-23 MED ORDER — NALOXONE HCL 2 MG/2ML IJ SOSY
PREFILLED_SYRINGE | INTRAMUSCULAR | Status: AC
  Filled 2018-03-23: qty 4

## 2018-03-23 MED ORDER — NALOXONE HCL 2 MG/2ML IJ SOSY
1.0000 mg | PREFILLED_SYRINGE | Freq: Once | INTRAMUSCULAR | Status: AC
  Administered 2018-03-23: 1 mg via INTRAVENOUS

## 2018-03-23 MED ORDER — NALOXONE HCL 4 MG/10ML IJ SOLN
0.2500 mg/h | INTRAVENOUS | Status: DC
  Administered 2018-03-23: 0.25 mg/h via INTRAVENOUS
  Filled 2018-03-23 (×2): qty 10

## 2018-03-23 MED ORDER — SODIUM CHLORIDE 0.9 % IV SOLN
Freq: Once | INTRAVENOUS | Status: AC
  Administered 2018-03-23: 19:00:00 via INTRAVENOUS

## 2018-03-23 MED ORDER — HALOPERIDOL LACTATE 5 MG/ML IJ SOLN
2.0000 mg | Freq: Once | INTRAMUSCULAR | Status: AC
  Administered 2018-03-23: 2 mg via INTRAVENOUS
  Filled 2018-03-23: qty 1

## 2018-03-23 MED ORDER — SODIUM CHLORIDE 0.9 % IV BOLUS
1000.0000 mL | Freq: Once | INTRAVENOUS | Status: AC
  Administered 2018-03-23: 1000 mL via INTRAVENOUS

## 2018-03-23 NOTE — ED Notes (Signed)
ED Provider at bedside. 

## 2018-03-23 NOTE — ED Notes (Signed)
Pt agitated and combative in CT, Called and informed Dr Effie Shy. Orders received.

## 2018-03-23 NOTE — ED Notes (Signed)
Patient transported to CT. RN accompanied pt

## 2018-03-23 NOTE — ED Triage Notes (Signed)
EMS says pt was seen unresponsive laying on a bench at a convenient store.  Deputy arrived and gave her 2mg  nasal narcan.  Reports pt became a little more alert and she was combative with ems.  PT recently released from jail per ems.  Pt told deputy she'd been taking pain pills.  Pt combative but not opening her eyes at this time.  Pt moaned and was withdrawing from staff.  HR 142 with ems.

## 2018-03-23 NOTE — H&P (Signed)
History and Physical    Laura Wagner HQR:[id number] DOB: 1995-09-14 DOA: 03/23/2018  PCP: Patient, No Pcp Per   Patient coming from: Home.  I have personally briefly reviewed patient's old medical records in [facility]  Chief Complaint: AMS.  HPI: Laura Wagner is a 23 y.o. female with medical history significant of polysubstance abuse, postpartum hypertension who was found laying on advantage at a convenience store.  She was given 2 mg of Narcan nasally by a Conservator, museum/gallery.  EMS who also goes and seen and reported that the patient became more alert and was combative.  She was recently released from jail.  She told her that daily that she has been taking pills.  She was very restless in the emergency department and was given Haldol and Ativan.  She is currently sedated and unable to provide further history.  ED Course: Initial temperature was 92 F, pulse 134, respirations 21, blood pressure 125/95 mmHg and O2 sat 100% on room air.  The patient was given a 1000 mL NS bolus, Haldol and started on a Narcan infusion in the emergency department.  White count 7.3, hemoglobin 13.9 g/dL and platelets 982.  Venous blood gas show a decreased PO2, but was otherwise normal.  CMP shows a calcium of 8.7 mg/dL, all other values within normal limits.  Ammonia was 21 mol/L.  Urinalysis showed a hazy appearance, but was otherwise normal.  UDS was positive for cocaine, benzodiazepines and amphetamines.  Review of Systems: Unable to obtain..   Past Medical History:  Diagnosis Date  . Medical history non-contributory     Past Surgical History:  Procedure Laterality Date  . NO PAST SURGERIES       reports that she has been smoking cigarettes. She has a 0.50 pack-year smoking history. She has never used smokeless tobacco. She reports that she does not drink alcohol or use drugs.  No Known Allergies  Family History  Problem Relation Age of Onset  . Cancer Maternal Grandmother        breast   Prior to  Admission medications   Medication Sig Start Date End Date Taking? Authorizing Provider  amLODipine (NORVASC) 10 MG tablet Take 1 tablet (10 mg total) by mouth daily. Patient not taking: Reported on 02/17/2017 01/05/17   Cheral Marker, CNM  ibuprofen (ADVIL,MOTRIN) 600 MG tablet Take 1 tablet (600 mg total) by mouth every 6 (six) hours. Patient not taking: Reported on 02/02/2017 12/27/16   Conan Bowens, MD  medroxyPROGESTERone (DEPO-PROVERA) 150 MG/ML injection Inject 1 mL (150 mg total) every 3 (three) months into the muscle. 02/02/17   Cheral Marker, CNM  Prenatal Vit-Fe Fumarate-FA (PRENATAL VITAMINS) 28-0.8 MG TABS Take 1 tablet by mouth daily. Patient not taking: Reported on 02/02/2017 08/26/16   Lazaro Arms, MD    Physical Exam: Vitals:   03/23/18 2200 03/23/18 2215 03/23/18 2230 03/23/18 2245  BP: 102/65 (!) 106/59 108/74 111/60  Pulse: (!) 122 (!) 111 (!) 122 (!) 113  Resp: 19 18 (!) 21 16  Temp:      TempSrc:      SpO2:   99%   Weight:      Height:        Constitutional: NAD, calm, comfortable Eyes: PERRL, lids and conjunctivae normal ENMT: Mucous membranes are dry.  Posterior pharynx clear of any exudate or lesions. Neck: normal, supple, no masses, no thyromegaly Respiratory: Decreased breath sounds on bases, otherwise clear to auscultation bilaterally, no wheezing, no crackles. Normal  respiratory effort. No accessory muscle use.  Cardiovascular: Tachycardic at 119 bpm, no murmurs / rubs / gallops. No extremity edema. 2+ pedal pulses. No carotid bruits.  Abdomen: Soft, no tenderness, no masses palpated. No hepatosplenomegaly. Bowel sounds positive.  Musculoskeletal: no clubbing / cyanosis. Good ROM, no contractures. Normal muscle tone.  Skin: Small abrasions on extremities. Neurologic: Wakes up briefly, moves all extremities.  Unable to fully evaluate. Psychiatric: Sedated.  Labs on Admission: I have personally reviewed following labs and imaging  studies  CBC: Recent Labs  Lab 03/23/18 1846  WBC 7.3  NEUTROABS 4.9  HGB 13.9  HCT 42.8  MCV 91.3  PLT 367   Basic Metabolic Panel: Recent Labs  Lab 03/23/18 1846  NA 137  K 3.9  CL 107  CO2 24  GLUCOSE 89  BUN 10  CREATININE 0.50  CALCIUM 8.7*   GFR: Estimated Creatinine Clearance: 77.9 mL/min (by C-G formula based on SCr of 0.5 mg/dL). Liver Function Tests: Recent Labs  Lab 03/23/18 1846  AST 17  ALT 12  ALKPHOS 64  BILITOT 0.6  PROT 7.0  ALBUMIN 4.1   No results for input(s): LIPASE, AMYLASE in the last 168 hours. Recent Labs  Lab 03/23/18 1847  AMMONIA 21   Coagulation Profile: No results for input(s): INR, PROTIME in the last 168 hours. Cardiac Enzymes: No results for input(s): CKTOTAL, CKMB, CKMBINDEX, TROPONINI in the last 168 hours. BNP (last 3 results) No results for input(s): PROBNP in the last 8760 hours. HbA1C: No results for input(s): HGBA1C in the last 72 hours. CBG: No results for input(s): GLUCAP in the last 168 hours. Lipid Profile: No results for input(s): CHOL, HDL, LDLCALC, TRIG, CHOLHDL, LDLDIRECT in the last 72 hours. Thyroid Function Tests: No results for input(s): TSH, T4TOTAL, FREET4, T3FREE, THYROIDAB in the last 72 hours. Anemia Panel: No results for input(s): VITAMINB12, FOLATE, FERRITIN, TIBC, IRON, RETICCTPCT in the last 72 hours. Urine analysis:    Component Value Date/Time   COLORURINE YELLOW 03/23/2018 1846   APPEARANCEUR HAZY (A) 03/23/2018 1846   LABSPEC 1.021 03/23/2018 1846   PHURINE 7.0 03/23/2018 1846   GLUCOSEU NEGATIVE 03/23/2018 1846   HGBUR NEGATIVE 03/23/2018 1846   BILIRUBINUR NEGATIVE 03/23/2018 1846   KETONESUR NEGATIVE 03/23/2018 1846   PROTEINUR NEGATIVE 03/23/2018 1846   NITRITE NEGATIVE 03/23/2018 1846   LEUKOCYTESUR NEGATIVE 03/23/2018 1846    Radiological Exams on Admission: Ct Head Wo Contrast  Result Date: 03/23/2018 CLINICAL DATA:  Altered LOC EXAM: CT HEAD WITHOUT CONTRAST CT  CERVICAL SPINE WITHOUT CONTRAST TECHNIQUE: Multidetector CT imaging of the head and cervical spine was performed following the standard protocol without intravenous contrast. Multiplanar CT image reconstructions of the cervical spine were also generated. COMPARISON:  None. FINDINGS: CT HEAD FINDINGS Brain: No acute territorial infarction, hemorrhage or intracranial mass. The ventricles are nonenlarged. Motion degraded study Vascular: No hyperdense vessel or unexpected calcification. Skull: Normal. Negative for fracture or focal lesion. Sinuses/Orbits: No acute finding. Other: None CT CERVICAL SPINE FINDINGS Alignment: Straightening of the upper cervical vertebral bodies. Rotated appearance of C1 on C2 presumably due to head position. Excessive motion C4 and below hinders evaluation of the alignment as well as for fracture abnormality. Skull base and vertebrae: Limited evaluation for fracture C4 and below due to motion Soft tissues and spinal canal: Normal prevertebral soft tissue thickness Disc levels:  Limited evaluation due to motion Upper chest: Negative Other: None IMPRESSION: 1. Motion degraded images of the brain. No definite CT evidence for  acute intracranial abnormality 2. Very limited evaluation of the cervical spine due to patient motion. Excessive motion C4 and below hinders evaluation of cervical alignment and the presence of fractures. Electronically Signed   By: Jasmine PangKim  Fujinaga M.D.   On: 03/23/2018 21:08   Ct Cervical Spine Wo Contrast  Result Date: 03/23/2018 CLINICAL DATA:  Altered LOC EXAM: CT HEAD WITHOUT CONTRAST CT CERVICAL SPINE WITHOUT CONTRAST TECHNIQUE: Multidetector CT imaging of the head and cervical spine was performed following the standard protocol without intravenous contrast. Multiplanar CT image reconstructions of the cervical spine were also generated. COMPARISON:  None. FINDINGS: CT HEAD FINDINGS Brain: No acute territorial infarction, hemorrhage or intracranial mass. The  ventricles are nonenlarged. Motion degraded study Vascular: No hyperdense vessel or unexpected calcification. Skull: Normal. Negative for fracture or focal lesion. Sinuses/Orbits: No acute finding. Other: None CT CERVICAL SPINE FINDINGS Alignment: Straightening of the upper cervical vertebral bodies. Rotated appearance of C1 on C2 presumably due to head position. Excessive motion C4 and below hinders evaluation of the alignment as well as for fracture abnormality. Skull base and vertebrae: Limited evaluation for fracture C4 and below due to motion Soft tissues and spinal canal: Normal prevertebral soft tissue thickness Disc levels:  Limited evaluation due to motion Upper chest: Negative Other: None IMPRESSION: 1. Motion degraded images of the brain. No definite CT evidence for acute intracranial abnormality 2. Very limited evaluation of the cervical spine due to patient motion. Excessive motion C4 and below hinders evaluation of cervical alignment and the presence of fractures. Electronically Signed   By: Jasmine PangKim  Fujinaga M.D.   On: 03/23/2018 21:08   Dg Chest Port 1 View  Result Date: 03/23/2018 CLINICAL DATA:  Altered mental status EXAM: PORTABLE CHEST 1 VIEW COMPARISON:  None. FINDINGS: The heart size and mediastinal contours are within normal limits. Both lungs are clear. The visualized skeletal structures are unremarkable. IMPRESSION: No active disease. Electronically Signed   By: Jasmine PangKim  Fujinaga M.D.   On: 03/23/2018 19:13    EKG: Independently reviewed.    Assessment/Plan Principal Problem:   Altered mental status Likely due to substance abuse. Neurochecks every 4 hours. Continue IV fluids.  Active Problems:   Polysubstance abuse (HCC) Continue IV fluids. Monitor for any signs of withdrawal. Consider behavioral health evaluation.    Anemia Monitor hematocrit and hemoglobin.    DVT prophylaxis: Heparin SQ. Code Status: Full code. Family Communication: None at bedside. Disposition Plan:  Observation for IV hydration and close monitoring. Consults called: Admission status: Stepdown/observation.   Bobette Moavid Manuel Vraj Denardo MD [facility]  If 7PM-7AM, please contact night-coverage [email or web address] Password [email or web address]  03/23/2018, 11:47 PM

## 2018-03-23 NOTE — ED Notes (Signed)
Warming blanket removed 

## 2018-03-23 NOTE — ED Notes (Signed)
Warming blanket applied.

## 2018-03-23 NOTE — ED Provider Notes (Signed)
[facility] EMERGENCY DEPARTMENT Provider Note   CSN: [id number] Arrival date & time: 03/23/18  1835     History   Chief Complaint Chief Complaint  Patient presents with  . Drug Overdose    HPI Laura Wagner is a 23 y.o. female.  HPI   Patient presents by EMS found outside in cold weather, unresponsive.  She was felt to be a possible narcotic overdose was given Narcan, nasal spray.  She apparently became arousable but combative.  Apparently she told someone at the scene that she took "some pills."  Reportedly, she was recently released from jail.  He is unable to give any history.  Level 5 caveat-altered mental status  Past Medical History:  Diagnosis Date  . Medical history non-contributory     Patient Active Problem List   Diagnosis Date Noted  . Altered mental status 03/23/2018  . 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 Severe preeclampsia 12/25/2016  . Smoker 07/30/2016    Past Surgical History:  Procedure Laterality Date  . NO PAST SURGERIES       OB History    Gravida  2   Para  1   Term  1   Preterm      AB  1   Living  1     SAB  1   TAB      Ectopic      Multiple  0   Live Births  1            Home Medications    Prior to Admission medications   Medication Sig Start Date End Date Taking? Authorizing Provider  amLODipine (NORVASC) 10 MG tablet Take 1 tablet (10 mg total) by mouth daily. Patient not taking: Reported on 02/17/2017 01/05/17   Cheral Marker, CNM  ibuprofen (ADVIL,MOTRIN) 600 MG tablet Take 1 tablet (600 mg total) by mouth every 6 (six) hours. Patient not taking: Reported on 02/02/2017 12/27/16   Conan Bowens, MD  medroxyPROGESTERone (DEPO-PROVERA) 150 MG/ML injection Inject 1 mL (150 mg total) every 3 (three) months into the muscle. 02/02/17   Cheral Marker, CNM  Prenatal Vit-Fe Fumarate-FA (PRENATAL VITAMINS) 28-0.8 MG TABS Take 1 tablet by mouth daily. Patient not taking: Reported on 02/02/2017 08/26/16    Lazaro Arms, MD    Family History Family History  Problem Relation Age of Onset  . Cancer Maternal Grandmother        breast    Social History Social History   Tobacco Use  . Smoking status: Current Every Day Smoker    Packs/day: 0.25    Years: 2.00    Pack years: 0.50    Types: Cigarettes  . Smokeless tobacco: Never Used  Substance Use Topics  . Alcohol use: No  . Drug use: No     Allergies   Patient has no known allergies.   Review of Systems Review of Systems  Unable to perform ROS: Mental status change     Physical Exam Updated Vital Signs BP 111/60   Pulse (!) 113   Temp 98.8 F (37.1 C) (Rectal)   Resp 16   Ht 5' (1.524 m)   Wt 47.3 kg   SpO2 99%   BMI 20.35 kg/m   Physical Exam Vitals signs and nursing note reviewed.  Constitutional:      General: She is in acute distress.     Appearance: She is well-developed. She is ill-appearing. She is not diaphoretic.  HENT:     Head:  Normocephalic and atraumatic.     Right Ear: Tympanic membrane normal.     Left Ear: Tympanic membrane normal.     Nose: Nose normal. No congestion.     Mouth/Throat:     Mouth: Mucous membranes are moist.  Eyes:     General: No scleral icterus.       Right eye: No discharge.        Left eye: No discharge.     Conjunctiva/sclera: Conjunctivae normal.     Comments: Pupils 2 mm bilaterally  Neck:     Musculoskeletal: Normal range of motion and neck supple.     Trachea: Phonation normal.  Cardiovascular:     Rate and Rhythm: Regular rhythm. Tachycardia present.  Pulmonary:     Effort: Pulmonary effort is normal. No respiratory distress.     Breath sounds: Normal breath sounds. No stridor.  Chest:     Chest wall: No tenderness.  Abdominal:     General: There is no distension.     Palpations: Abdomen is soft.     Tenderness: There is no abdominal tenderness. There is no guarding.  Musculoskeletal: Normal range of motion.        General: No swelling or  tenderness.  Skin:    General: Skin is warm and dry.     Coloration: Skin is not jaundiced.  Neurological:     Mental Status: She is alert.     GCS: GCS eye subscore is 1. GCS verbal subscore is 2. GCS motor subscore is 5.     Cranial Nerves: No cranial nerve deficit.     Motor: No weakness, abnormal muscle tone or seizure activity.  Psychiatric:     Comments: Agitated      ED Treatments / Results  Labs (all labs ordered are listed, but only abnormal results are displayed) Labs Reviewed  COMPREHENSIVE METABOLIC PANEL - Abnormal; Notable for the following components:      Result Value   Calcium 8.7 (*)    All other components within normal limits  URINALYSIS, ROUTINE W REFLEX MICROSCOPIC - Abnormal; Notable for the following components:   APPearance HAZY (*)    All other components within normal limits  RAPID URINE DRUG SCREEN, HOSP PERFORMED - Abnormal; Notable for the following components:   Cocaine POSITIVE (*)    Benzodiazepines POSITIVE (*)    Amphetamines POSITIVE (*)    All other components within normal limits  BLOOD GAS, VENOUS - Abnormal; Notable for the following components:   pO2, Ven 88.9 (*)    Acid-Base Excess 2.5 (*)    All other components within normal limits  ACETAMINOPHEN LEVEL - Abnormal; Notable for the following components:   Acetaminophen (Tylenol), Serum <10 (*)    All other components within normal limits  CBC WITH DIFFERENTIAL/PLATELET  ETHANOL  SALICYLATE LEVEL  AMMONIA    EKG EKG Interpretation  Date/Time:  Tuesday March 23 2018 18:42:36 EST Ventricular Rate:  134 PR Interval:    QRS Duration: 92 QT Interval:  293 QTC Calculation: 438 R Axis:   76 Text Interpretation:  Sinus tachycardia No old tracing to compare Confirmed by Mancel BaleWentz, Geoge Lawrance [phone]([id number]) on 03/23/2018 6:51:10 PM   Radiology Ct Head Wo Contrast  Result Date: 03/23/2018 CLINICAL DATA:  Altered LOC EXAM: CT HEAD WITHOUT CONTRAST CT CERVICAL SPINE WITHOUT CONTRAST TECHNIQUE:  Multidetector CT imaging of the head and cervical spine was performed following the standard protocol without intravenous contrast. Multiplanar CT image reconstructions of the cervical spine were  also generated. COMPARISON:  None. FINDINGS: CT HEAD FINDINGS Brain: No acute territorial infarction, hemorrhage or intracranial mass. The ventricles are nonenlarged. Motion degraded study Vascular: No hyperdense vessel or unexpected calcification. Skull: Normal. Negative for fracture or focal lesion. Sinuses/Orbits: No acute finding. Other: None CT CERVICAL SPINE FINDINGS Alignment: Straightening of the upper cervical vertebral bodies. Rotated appearance of C1 on C2 presumably due to head position. Excessive motion C4 and below hinders evaluation of the alignment as well as for fracture abnormality. Skull base and vertebrae: Limited evaluation for fracture C4 and below due to motion Soft tissues and spinal canal: Normal prevertebral soft tissue thickness Disc levels:  Limited evaluation due to motion Upper chest: Negative Other: None IMPRESSION: 1. Motion degraded images of the brain. No definite CT evidence for acute intracranial abnormality 2. Very limited evaluation of the cervical spine due to patient motion. Excessive motion C4 and below hinders evaluation of cervical alignment and the presence of fractures. Electronically Signed   By: Jasmine PangKim  Fujinaga M.D.   On: 03/23/2018 21:08   Ct Cervical Spine Wo Contrast  Result Date: 03/23/2018 CLINICAL DATA:  Altered LOC EXAM: CT HEAD WITHOUT CONTRAST CT CERVICAL SPINE WITHOUT CONTRAST TECHNIQUE: Multidetector CT imaging of the head and cervical spine was performed following the standard protocol without intravenous contrast. Multiplanar CT image reconstructions of the cervical spine were also generated. COMPARISON:  None. FINDINGS: CT HEAD FINDINGS Brain: No acute territorial infarction, hemorrhage or intracranial mass. The ventricles are nonenlarged. Motion degraded study  Vascular: No hyperdense vessel or unexpected calcification. Skull: Normal. Negative for fracture or focal lesion. Sinuses/Orbits: No acute finding. Other: None CT CERVICAL SPINE FINDINGS Alignment: Straightening of the upper cervical vertebral bodies. Rotated appearance of C1 on C2 presumably due to head position. Excessive motion C4 and below hinders evaluation of the alignment as well as for fracture abnormality. Skull base and vertebrae: Limited evaluation for fracture C4 and below due to motion Soft tissues and spinal canal: Normal prevertebral soft tissue thickness Disc levels:  Limited evaluation due to motion Upper chest: Negative Other: None IMPRESSION: 1. Motion degraded images of the brain. No definite CT evidence for acute intracranial abnormality 2. Very limited evaluation of the cervical spine due to patient motion. Excessive motion C4 and below hinders evaluation of cervical alignment and the presence of fractures. Electronically Signed   By: Jasmine PangKim  Fujinaga M.D.   On: 03/23/2018 21:08   Dg Chest Port 1 View  Result Date: 03/23/2018 CLINICAL DATA:  Altered mental status EXAM: PORTABLE CHEST 1 VIEW COMPARISON:  None. FINDINGS: The heart size and mediastinal contours are within normal limits. Both lungs are clear. The visualized skeletal structures are unremarkable. IMPRESSION: No active disease. Electronically Signed   By: Jasmine PangKim  Fujinaga M.D.   On: 03/23/2018 19:13    Procedures .Critical Care Performed by: Mancel BaleWentz, Dayveon Halley, MD Authorized by: Mancel BaleWentz, Liza Czerwinski, MD   Critical care provider statement:    Critical care time (minutes):  75   Critical care start time:  03/23/2018 6:35 PM   Critical care end time:  03/23/2018 11:33 PM   Critical care time was exclusive of:  Separately billable procedures and treating other patients   Critical care was necessary to treat or prevent imminent or life-threatening deterioration of the following conditions:  [place]oxidrome   Critical care was time spent personally  by me on the following activities:  Blood draw for specimens, development of treatment plan with patient or surrogate, discussions with consultants, evaluation of patient's response to  treatment, examination of patient, obtaining history from patient or surrogate, ordering and performing treatments and interventions, ordering and review of laboratory studies, pulse oximetry, re-evaluation of patient's condition, review of old charts and ordering and review of radiographic studies   (including critical care time)  Medications Ordered in ED Medications  naloxone HCl (NARCAN) 4 mg in dextrose 5 % 250 mL infusion (0.25 mg/hr Intravenous New Bag/Given 03/23/18 1918)  naloxone [facility]) injection 1 mg (1 mg Intravenous Given 03/23/18 1850)  0.9 %  sodium chloride infusion ( Intravenous Stopped 03/23/18 2005)  haloperidol lactate (HALDOL) injection 2 mg (2 mg Intravenous Given 03/23/18 1927)  LORazepam (ATIVAN) 2 MG/ML injection (2 mg  Given 03/23/18 1942)  sodium chloride 0.9 % bolus 1,000 mL (0 mLs Intravenous Stopped 03/23/18 2122)     Initial Impression / Assessment and Plan / ED Course  I have reviewed the triage vital signs and the nursing notes.  Pertinent labs & imaging results that were available during my care of the patient were reviewed by me and considered in my medical decision making (see chart for details).  Clinical Course as of Mar 23 2332  Tue Mar 23, 2018  7564 She was given Narcan without change in her neurologic status.  At this time she is restless, eyes closed, moving continuously.  Narcan drip being started, will treat with Haldol and get CT images of head and cervical spine.   [EW]  1918 Suspect multiple drug ingestion toxicity.  Will evaluate for fixed lesion in brain, and continue to monitor.  Respiratory status is acceptable.  She does not require intubation at this time.   [EW]  1919 Sedation will be ordered for CT images.   [EW]  2034 She continues to be restless.  Rectal  temperature now up to 96 degrees.  Tachycardia to 128, additional IV fluids ordered.   [EW]  2034 Normal  Ammonia [EW]  2034 Normal except calcium low  Comprehensive metabolic panel(!) [EW]  2034 Normal except PO2 elevated for venous gas, and acid-base increased  Blood gas, venous(!) [EW]  2034 Normal  Salicylate level [EW]  2035 Abnormal, presence of substances of abuse, cocaine, benzodiazepine, amphetamines  Urine rapid drug screen (hosp performed)(!) [EW]  2035 Normal  Acetaminophen level(!) [EW]  2035 Normal  Urinalysis, Routine w reflex microscopic(!) [EW]  2035 No acute abnormality, images reviewed by me  DG Chest Port 1 View [EW]  2118 No clear intracranial abnormality.  Images reviewed by me  CT Head Wo Contrast [EW]  2118 Inadequate images, secondary to motion artifact.  No clear upper cervical spine injury.  Images reviewed by me  CT Cervical Spine Wo Contrast [EW]  2119 At this time the patient appears to have a toxic encephalopathy secondary to polysubstance abuse.  No acute respiratory distress.  No evidence for acute intracranial abnormality.  Patient will require observation, likely overnight in the hospital.   [EW]    Clinical Course User Index [EW] Mancel Bale, MD     Patient Vitals for the past 24 hrs:  BP Temp Temp src Pulse Resp SpO2 Height Weight  03/23/18 2245 111/60 - - (!) 113 16 - - -  03/23/18 2230 108/74 - - (!) 122 (!) 21 99 % - -  03/23/18 2215 (!) 106/59 - - (!) 111 18 - - -  03/23/18 2200 102/65 - - (!) 122 19 - - -  03/23/18 2145 (!) 107/58 - - (!) 123 18 - - -  03/23/18 2139 -  98.8 F (37.1 C) Rectal - - - - -  03/23/18 2130 115/69 - - (!) 121 17 - - -  03/23/18 2115 117/78 - - (!) 119 15 - - -  03/23/18 2102 109/71 - - (!) 120 17 98 % - -  03/23/18 2100 109/71 - - (!) 120 17 - - -  03/23/18 2045 118/85 - - (!) 120 14 - - -  03/23/18 2034 122/78 - - (!) 130 (!) 22 - - -  03/23/18 2017 123/80 - - - 16 - - -  03/23/18 2011 - (!) 96.3 F  (35.7 C) Rectal - - - - -  03/23/18 1930 131/87 - - - 17 - - -  03/23/18 1900 (!) 134/99 - - - (!) 25 - - -  03/23/18 1846 - - - - - - 5' (1.524 m) 47.3 kg  03/23/18 1845 (!) 125/95 (!) 93 F (33.9 C) Rectal (!) 134 16 100 % - -  03/23/18 1841 (!) 125/95 - - - (!) 21 - - -    11:01 PM Reevaluation with update and discussion. After initial assessment and treatment, an updated evaluation reveals patient continues to be nonverbal, but is resting comfortably at this time.  She does not appear agitated.  Rectal temperature is near normal.  Rate stabilizing mid 1 teens.  Patient will require hospitalization. Mancel Bale   Medical Decision Making: Encephalopathy, nonspecific likely secondary to polysubstance abuse following recent incarceration.  No evidence for intracranial injury.  Doubt serious bacterial infection or impending vascular collapse.  This case does not appear to represent a acute drug withdrawal syndrome.  Doubt CVA.  Doubt traumatic injuries.  She will require hospitalization for observation and treatment.  Hypothermia improved with warming blanket.  CRITICAL CARE-yes Performed by: Mancel Bale  Nursing Notes Reviewed/ Care Coordinated Applicable Imaging Reviewed Interpretation of Laboratory Data incorporated into ED treatment  10:55 PM-Consult complete with hospitalist. Patient case explained and discussed.  The agrees to admit patient for further evaluation and treatment. Call ended at 10:58 PM  Final Clinical Impressions(s) / ED Diagnoses   Final diagnoses:  Polysubstance abuse [facility])  Encephalopathy    ED Discharge Orders    None       Mancel Bale, MD 03/23/18 2334

## 2018-03-24 ENCOUNTER — Encounter (HOSPITAL_COMMUNITY): Payer: Self-pay | Admitting: Registered Nurse

## 2018-03-24 ENCOUNTER — Other Ambulatory Visit: Payer: Self-pay

## 2018-03-24 DIAGNOSIS — T68XXXA Hypothermia, initial encounter: Secondary | ICD-10-CM | POA: Diagnosis present

## 2018-03-24 LAB — COMPREHENSIVE METABOLIC PANEL
ALBUMIN: 3.4 g/dL — AB (ref 3.5–5.0)
ALT: 8 U/L (ref 0–44)
AST: 16 U/L (ref 15–41)
Alkaline Phosphatase: 49 U/L (ref 38–126)
Anion gap: 3 — ABNORMAL LOW (ref 5–15)
BUN: 9 mg/dL (ref 6–20)
CO2: 23 mmol/L (ref 22–32)
Calcium: 8.1 mg/dL — ABNORMAL LOW (ref 8.9–10.3)
Chloride: 113 mmol/L — ABNORMAL HIGH (ref 98–111)
Creatinine, Ser: 0.59 mg/dL (ref 0.44–1.00)
GFR calc Af Amer: >60 mL/min (ref >60)
GFR calc non Af Amer: >60 mL/min (ref >60)
Glucose, Bld: 72 mg/dL (ref 70–99)
Potassium: 3.4 mmol/L — ABNORMAL LOW (ref 3.5–5.1)
SODIUM: 139 mmol/L (ref 135–145)
Total Bilirubin: 0.4 mg/dL (ref 0.3–1.2)
Total Protein: 5.6 g/dL — ABNORMAL LOW (ref 6.5–8.1)

## 2018-03-24 LAB — CBC WITH DIFFERENTIAL/PLATELET
ABS IMMATURE GRANULOCYTES: 0.02 10*3/uL (ref 0.00–0.07)
Basophils Absolute: 0 10*3/uL (ref 0.0–0.1)
Basophils Relative: 1 %
Eosinophils Absolute: 0.1 10*3/uL (ref 0.0–0.5)
Eosinophils Relative: 2 %
HCT: 35.6 % — ABNORMAL LOW (ref 36.0–46.0)
Hemoglobin: 11.4 g/dL — ABNORMAL LOW (ref 12.0–15.0)
Immature Granulocytes: 0 %
Lymphocytes Relative: 33 %
Lymphs Abs: 2.8 10*3/uL (ref 0.7–4.0)
MCH: 29.8 pg (ref 26.0–34.0)
MCHC: 32 g/dL (ref 30.0–36.0)
MCV: 93.2 fL (ref 80.0–100.0)
Monocytes Absolute: 0.5 10*3/uL (ref 0.1–1.0)
Monocytes Relative: 6 %
NEUTROS ABS: 5 10*3/uL (ref 1.7–7.7)
NEUTROS PCT: 58 %
NRBC: 0 % (ref 0.0–0.2)
Platelets: 334 10*3/uL (ref 150–400)
RBC: 3.82 MIL/uL — ABNORMAL LOW (ref 3.87–5.11)
RDW: 12.4 % (ref 11.5–15.5)
WBC: 8.5 10*3/uL (ref 4.0–10.5)

## 2018-03-24 LAB — MRSA PCR SCREENING: MRSA by PCR: NEGATIVE

## 2018-03-24 MED ORDER — ONDANSETRON HCL 4 MG/2ML IJ SOLN: 4.0000 mg | Freq: Four times a day (QID) | INTRAMUSCULAR | Status: DC | PRN

## 2018-03-24 MED ORDER — ACETAMINOPHEN 325 MG PO TABS: 650.0000 mg | ORAL_TABLET | Freq: Four times a day (QID) | ORAL | Status: DC | PRN

## 2018-03-24 MED ORDER — ACETAMINOPHEN 650 MG RE SUPP: 650.0000 mg | Freq: Four times a day (QID) | RECTAL | Status: DC | PRN

## 2018-03-24 MED ORDER — HEPARIN SODIUM (PORCINE) 5000 UNIT/ML IJ SOLN
5000.0000 [IU] | Freq: Three times a day (TID) | INTRAMUSCULAR | Status: DC
  Administered 2018-03-24 – 2018-03-25 (×4): 5000 [IU] via SUBCUTANEOUS
  Filled 2018-03-24 (×4): qty 1

## 2018-03-24 MED ORDER — ONDANSETRON HCL 4 MG PO TABS: 4.0000 mg | ORAL_TABLET | Freq: Four times a day (QID) | ORAL | Status: DC | PRN

## 2018-03-24 NOTE — Progress Notes (Signed)
Pt is Laura Wagner sedated, moving around in the bed. She will open her eyes to voice and withdraws from pain. She will squeeze your hands. All her belongings are in patient bags at the nurses desk

## 2018-03-24 NOTE — Progress Notes (Signed)
Pt meets inpatient criteria per  Assunta Found, NP. Referral information has been sent to the following hospitals for review: [facility]  [facility]  [facility]  [facility]  [facility]  [facility]  [facility]   Disposition will continue to assist with inpatient placement needs.   Wells Guiles, LCSW, LCAS Disposition [facility]/TTS [phone]

## 2018-03-24 NOTE — BH Assessment (Signed)
Tele Assessment Note   Patient Name: Laura Wagner MRN: [id number] Referring Physician: Elvera Lennox Location of Patient: [facility] Location of Provider: Behavioral Health TTS Department  Laura Wagner is an 23 y.o. female presenting to [facility] ED via EMS. Per RN triage note: "EMS says patient was found unresponsive on a bench at a convenience store. Deputy arrived and gave her 2 mg nasal narcan. Reports pt became a little more alert and was combative with EMS. Patient recently released from jail per EMS. Patient told deputy that she'd been taking pills." At time of assessment patient is awake and oriented x 3. She is a poor historian and difficult to assess as she falls in and out of consciousness and sporadically breaks into tears. Patient shares that she has a 76 month old daughter and she began to cry. Patient was unable to provide any mental health history. Patient reports she spent the weekend in jail for a FTA. Patient states she lives in [place] and does not have any history of mental health or substance use treatment. Patient's UDS is positive for amphetamines, cocaine, and benzodiazepines. When asked if she was suicidal or the overdose was intentional she only cried and would not give an answer. Patient gave verbal consent for TTS to speak with her mother, Laura Wagner [phone] to obtain collateral information.  Per collateral, patient began using methamphetamines a little over 1 year ago when she was pregnant with her daughter, who is now a little over a year old. She states that patient ended up losing custody of her daughter to the state and that is when her depression really started. She does not believe that prior to her pregnancy she used drugs. Collateral believes that patient took drugs intentionally as a suicide attempt. She states that her daughter came to live with her for a period of time this year but when she could not stay clean they had a falling out. Mother reports patient does not have  any documented mental health history and has never been in any therapy, rehab, or treatment of any kind. She is currently living in a car with her boyfriend who also uses methamphetamines.  Laura Rankin, NP recommends in patient treatment based on mother's collateral.   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 Medical history non-contributory     Past Surgical History:  Procedure Laterality Date  . NO PAST SURGERIES      Family History:  Family History  Problem Relation Age of Onset  . Cancer Maternal Grandmother        breast    Social History:  reports that she has been smoking cigarettes. She has a 0.50 pack-year smoking history. She has never used smokeless tobacco. She reports that she does not drink alcohol or use drugs.  Additional Social History:  Alcohol / Drug Use Pain Medications: see MAR Prescriptions: see MAR Over the Counter: see MAR History of alcohol / drug use?: Yes Longest period of sobriety (when/how long): UTA Substance #1 Name of Substance 1: amphetamines 1 - Age of First Use: 20 1 - Amount (size/oz): UTA 1 - Frequency: UTA 1 - Duration: UTA 1 - Last Use / Amount: UTA  CIWA: CIWA-Ar BP: (!) 120/95 Pulse Rate: 83 COWS:    Allergies: No Known Allergies  Home Medications:  Medications Prior to Admission  Medication Sig Dispense Refill  . amLODipine (NORVASC) 10 MG tablet  Take 1 tablet (10 mg total) by mouth daily. (Patient not taking: Reported on 02/17/2017) 30 tablet 1  . ibuprofen (ADVIL,MOTRIN) 600 MG tablet Take 1 tablet (600 mg total) by mouth every 6 (six) hours. (Patient not taking: Reported on 02/02/2017) 30 tablet 0  . medroxyPROGESTERone (DEPO-PROVERA) 150 MG/ML injection Inject 1 mL (150 mg total) every 3 (three) months into the muscle. 1 mL 3  . Prenatal Vit-Fe Fumarate-FA (PRENATAL VITAMINS) 28-0.8 MG TABS Take 1  tablet by mouth daily. (Patient not taking: Reported on 02/02/2017) 30 tablet 11    OB/GYN Status:  No LMP recorded.  General Assessment Data Location of Assessment: [facility] TTS Assessment: In system Is this a Tele or Face-to-Face Assessment?: Tele Assessment Is this an Initial Assessment or a Re-assessment for this encounter?: Initial Assessment Patient Accompanied by:: N/A Language Other than English: No Living Arrangements: Homeless/Shelter What gender do you identify as?: Female Marital status: Single Maiden name: Berrie Pregnancy Status: Unknown Living Arrangements: (homeless) Can pt return to current living arrangement?: Yes Admission Status: Voluntary Is patient capable of signing voluntary admission?: Yes Referral Source: Medical Floor Inpatient Insurance type: None     Crisis Care Plan Living Arrangements: (homeless)     Risk to self with the past 6 months Suicidal Ideation: (UTA) Has patient been a risk to self within the past 6 months prior to admission? : (UTA) Suicidal Intent: (UTA) Has patient had any suicidal intent within the past 6 months prior to admission? : (UTA) Is patient at risk for suicide?: (UTA) Suicidal Plan?: (UTA) Has patient had any suicidal plan within the past 6 months prior to admission? : (UTA) Access to Means: (UTA) What has been your use of drugs/alcohol within the last 12 months?: amphetamine, cocaine, benzos Previous Attempts/Gestures: No How many times?: 0 Other Self Harm Risks: drug use Triggers for Past Attempts: None known Intentional Self Injurious Behavior: None Family Suicide History: No Recent stressful life event(s): Legal Issues, Financial Problems, Loss (Comment)(child taken by DSS) Persecutory voices/beliefs?: No Depression: Yes Depression Symptoms: Tearfulness, Loss of interest in usual pleasures, Feeling worthless/self pity, Feeling angry/irritable, Guilt, Despondent, Isolating, Fatigue, Insomnia Substance abuse  history and/or treatment for substance abuse?: No Suicide prevention information given to non-admitted patients: Not applicable  Risk to Others within the past 6 months Homicidal Ideation: (UTA) Does patient have any lifetime risk of violence toward others beyond the six months prior to admission? : No Thoughts of Harm to Others: (UTA) Current Homicidal Intent: No Current Homicidal Plan: No Access to Homicidal Means: (UTA) Identified Victim: (none reported) History of harm to others?: No Assessment of Violence: None Noted Violent Behavior Description: (none reported) Does patient have access to weapons?: No Criminal Charges Pending?: (UTA) Does patient have a court date: (UTA) Is patient on probation?: Unknown  Psychosis Hallucinations: (UTA) Delusions: None noted  Mental Status Report Appearance/Hygiene: In hospital gown Eye Contact: Poor Motor Activity: Freedom of movement Speech: Incoherent, Slow, Slurred Level of Consciousness: Drowsy, Quiet/awake Mood: Helpless Affect: Depressed Anxiety Level: None Thought Processes: Unable to Assess Judgement: Impaired Orientation: Person, Place, Situation Obsessive Compulsive Thoughts/Behaviors: None  Cognitive Functioning Concentration: Unable to Assess Memory: Unable to Assess Is patient IDD: No Insight: Poor Impulse Control: Poor Appetite: (UTA) Have you had any weight changes? : (UTA) Sleep: Unable to Assess Total Hours of Sleep: (UTA) Vegetative Symptoms: Unable to Assess  ADLScreening [facility] Assessment Services) Patient's cognitive ability adequate to safely complete daily activities?: Yes Patient able to express need for assistance  with ADLs?: Yes Independently performs ADLs?: Yes (appropriate for developmental age)  Prior Inpatient Therapy Prior Inpatient Therapy: No  Prior Outpatient Therapy Prior Outpatient Therapy: No Does patient have an ACCT team?: No Does patient have Intensive In-House Services?  :  No Does patient have [facility] services? : No Does patient have P4CC services?: No  ADL Screening (condition at time of admission) Patient's cognitive ability adequate to safely complete daily activities?: Yes Is the patient deaf or have difficulty hearing?: No Does the patient have difficulty seeing, even when wearing glasses/contacts?: No Does the patient have difficulty concentrating, remembering, or making decisions?: No Patient able to express need for assistance with ADLs?: Yes Does the patient have difficulty dressing or bathing?: No Independently performs ADLs?: Yes (appropriate for developmental age) Does the patient have difficulty walking or climbing stairs?: No Weakness of Legs: None Weakness of Arms/Hands: None  Home Assistive Devices/Equipment Home Assistive Devices/Equipment: None  Therapy Consults (therapy consults require a physician order) PT Evaluation Needed: No OT Evalulation Needed: No SLP Evaluation Needed: No Abuse/Neglect Assessment (Assessment to be complete while patient is alone) Abuse/Neglect Assessment Can Be Completed: Unable to assess, patient is non-responsive or altered mental status     Advance Directives (For Healthcare) Does Patient Have a Medical Advance Directive?: No Would patient like information on creating a medical advance directive?: No - Patient declined Nutrition Screen- [facility] Adult/[facility] Patient's home diet: Regular        Disposition: Laura Rankin, NP recommends in patient treatment based on mother's collateral.  Disposition Initial Assessment Completed for this Encounter: Yes  This service was provided via telemedicine using a 2-way, interactive audio and video technology.  Names of all persons participating in this telemedicine service and their role in this encounter. Name: Celedonio Miyamoto, [place] Role: TTS  Name: [name] Finder Role: patient  Name:  Role:   Name:  Role:     Celedonio Miyamoto 03/24/2018 4:00 PM

## 2018-03-24 NOTE — Progress Notes (Signed)
TTS in Progress 

## 2018-03-24 NOTE — Progress Notes (Signed)
Pt is [place] sedated, moving around some in bed. She will open her eyes a little in response to voice and withdraws from painful stimulation. Unable to compete admission documentation with patient unable to respond. Safety sitter in room. Continuing to monitor patient.

## 2018-03-24 NOTE — Progress Notes (Signed)
PROGRESS NOTE  Cyndal Donatelli NOM:[id number] DOB: Sep 15, 1995 DOA: 03/23/2018 PCP: Patient, No Pcp Per   LOS: 0 days   Brief Narrative / Interim history: Jillia Courtwright is a 23 y.o. female with medical history significant of polysubstance abuse, postpartum hypertension who was found laying down at a convenience store.  She was given 2 mg of Narcan nasally by a Conservator, museum/gallery.  EMS who also goes and seen and reported that the patient became more alert and was combative.  She was recently released from jail.  She told her that daily that she has been taking pills.  She was very restless in the emergency department and was given Haldol and Ativan.  Subjective: -Lethargic, opens her eyes when called but drifts back asleep.  Assessment & Plan: Principal Problem:   Altered mental status Active Problems:   Polysubstance abuse (HCC)   Hypothermia   Principal Problem Acute encephalopathy likely in the setting of polysubstance abuse -Patient apparently responded to Narcan and was started on a Narcan drip last night.  Urine drug screen was positive for cocaine, benzodiazepines as well as amphetamines.  Did not show any opiates, will discontinue Narcan infusion -She is able to protect her airway, vital signs are stable, oxygen saturations are good, will continue to closely monitor in the ICU setting. -Continue sitter, when awake will determine intent  Active Problems Mild anemia -Stable, no evidence of bleeding,  Scheduled Meds: . heparin  5,000 Units Subcutaneous Q8H   Continuous Infusions: PRN Meds:.acetaminophen **OR** acetaminophen, ondansetron **OR** ondansetron (ZOFRAN) IV  DVT prophylaxis: Heparin Code Status: Full code Family Communication: No family at bedside Disposition Plan: Home when more awake  Consultants:   None  Procedures:   None   Antimicrobials:  None    Objective: Vitals:   03/24/18 0744 03/24/18 0800 03/24/18 0900 03/24/18 1000  BP:  114/78 123/75 115/70  Pulse:  93 93 (!) 104 89  Resp: 11 12 (!) 21 11  Temp: 97.9 F (36.6 C)     TempSrc: Oral     SpO2: 99% 100% 100% 99%  Weight:      Height:        Intake/Output Summary (Last 24 hours) at 03/24/2018 1044 Last data filed at 03/23/2018 2122 Gross per 24 hour  Intake 2000 ml  Output -  Net 2000 ml   Filed Weights   03/23/18 1846 03/24/18 0048 03/24/18 0500  Weight: 47.3 kg 49.3 kg 49.3 kg    Examination:  Constitutional: Lethargic Eyes: PERRL, lids and conjunctivae normal ENMT: Mucous membranes are moist.  Neck: normal, supple Respiratory: clear to auscultation bilaterally, no wheezing, no crackles.  Shallow respiratory effort Cardiovascular: Regular rate and rhythm, no murmurs / rubs / gallops. No LE edema. 2+ pedal pulses.  Abdomen: no tenderness. Bowel sounds positive.  Musculoskeletal: no clubbing / cyanosis. Skin: no rashes    Data Reviewed: I have independently reviewed following labs and imaging studies  CBC: Recent Labs  Lab 03/23/18 1846 03/24/18 0107  WBC 7.3 8.5  NEUTROABS 4.9 5.0  HGB 13.9 11.4*  HCT 42.8 35.6*  MCV 91.3 93.2  PLT 367 334   Basic Metabolic Panel: Recent Labs  Lab 03/23/18 1846 03/24/18 0107  NA 137 139  K 3.9 3.4*  CL 107 113*  CO2 24 23  GLUCOSE 89 72  BUN 10 9  CREATININE 0.50 0.59  CALCIUM 8.7* 8.1*   GFR: Estimated Creatinine Clearance: 77.9 mL/min (by C-G formula based on SCr of 0.59 mg/dL). Liver Function Tests:  Recent Labs  Lab 03/23/18 1846 03/24/18 0107  AST 17 16  ALT 12 8  ALKPHOS 64 49  BILITOT 0.6 0.4  PROT 7.0 5.6*  ALBUMIN 4.1 3.4*   No results for input(s): LIPASE, AMYLASE in the last 168 hours. Recent Labs  Lab 03/23/18 1847  AMMONIA 21   Coagulation Profile: No results for input(s): INR, PROTIME in the last 168 hours. Cardiac Enzymes: No results for input(s): CKTOTAL, CKMB, CKMBINDEX, TROPONINI in the last 168 hours. BNP (last 3 results) No results for input(s): PROBNP in the last 8760  hours. HbA1C: No results for input(s): HGBA1C in the last 72 hours. CBG: No results for input(s): GLUCAP in the last 168 hours. Lipid Profile: No results for input(s): CHOL, HDL, LDLCALC, TRIG, CHOLHDL, LDLDIRECT in the last 72 hours. Thyroid Function Tests: No results for input(s): TSH, T4TOTAL, FREET4, T3FREE, THYROIDAB in the last 72 hours. Anemia Panel: No results for input(s): VITAMINB12, FOLATE, FERRITIN, TIBC, IRON, RETICCTPCT in the last 72 hours. Urine analysis:    Component Value Date/Time   COLORURINE YELLOW 03/23/2018 1846   APPEARANCEUR HAZY (A) 03/23/2018 1846   LABSPEC 1.021 03/23/2018 1846   PHURINE 7.0 03/23/2018 1846   GLUCOSEU NEGATIVE 03/23/2018 1846   HGBUR NEGATIVE 03/23/2018 1846   BILIRUBINUR NEGATIVE 03/23/2018 1846   KETONESUR NEGATIVE 03/23/2018 1846   PROTEINUR NEGATIVE 03/23/2018 1846   NITRITE NEGATIVE 03/23/2018 1846   LEUKOCYTESUR NEGATIVE 03/23/2018 1846   Sepsis Labs: Invalid input(s): PROCALCITONIN, LACTICIDVEN  Recent Results (from the past 240 hour(s))  MRSA PCR Screening     Status: None   Collection Time: 03/24/18 12:38 AM  Result Value Ref Range Status   MRSA by PCR NEGATIVE NEGATIVE Final    Comment:        The GeneXpert MRSA Assay (FDA approved for NASAL specimens only), is one component of a comprehensive MRSA colonization surveillance program. It is not intended to diagnose MRSA infection nor to guide or monitor treatment for MRSA infections. Performed at [facility], [address] [place], [place] [id number]       Radiology Studies: Ct Head Wo Contrast  Result Date: 03/23/2018 CLINICAL DATA:  Altered LOC EXAM: CT HEAD WITHOUT CONTRAST CT CERVICAL SPINE WITHOUT CONTRAST TECHNIQUE: Multidetector CT imaging of the head and cervical spine was performed following the standard protocol without intravenous contrast. Multiplanar CT image reconstructions of the cervical spine were also generated. COMPARISON:  None. FINDINGS: CT  HEAD FINDINGS Brain: No acute territorial infarction, hemorrhage or intracranial mass. The ventricles are nonenlarged. Motion degraded study Vascular: No hyperdense vessel or unexpected calcification. Skull: Normal. Negative for fracture or focal lesion. Sinuses/Orbits: No acute finding. Other: None CT CERVICAL SPINE FINDINGS Alignment: Straightening of the upper cervical vertebral bodies. Rotated appearance of C1 on C2 presumably due to head position. Excessive motion C4 and below hinders evaluation of the alignment as well as for fracture abnormality. Skull base and vertebrae: Limited evaluation for fracture C4 and below due to motion Soft tissues and spinal canal: Normal prevertebral soft tissue thickness Disc levels:  Limited evaluation due to motion Upper chest: Negative Other: None IMPRESSION: 1. Motion degraded images of the brain. No definite CT evidence for acute intracranial abnormality 2. Very limited evaluation of the cervical spine due to patient motion. Excessive motion C4 and below hinders evaluation of cervical alignment and the presence of fractures. Electronically Signed   By: Jasmine PangKim  Fujinaga M.D.   On: 03/23/2018 21:08   Ct Cervical Spine Wo Contrast  Result Date: 03/23/2018 CLINICAL DATA:  Altered LOC EXAM: CT HEAD WITHOUT CONTRAST CT CERVICAL SPINE WITHOUT CONTRAST TECHNIQUE: Multidetector CT imaging of the head and cervical spine was performed following the standard protocol without intravenous contrast. Multiplanar CT image reconstructions of the cervical spine were also generated. COMPARISON:  None. FINDINGS: CT HEAD FINDINGS Brain: No acute territorial infarction, hemorrhage or intracranial mass. The ventricles are nonenlarged. Motion degraded study Vascular: No hyperdense vessel or unexpected calcification. Skull: Normal. Negative for fracture or focal lesion. Sinuses/Orbits: No acute finding. Other: None CT CERVICAL SPINE FINDINGS Alignment: Straightening of the upper cervical vertebral  bodies. Rotated appearance of C1 on C2 presumably due to head position. Excessive motion C4 and below hinders evaluation of the alignment as well as for fracture abnormality. Skull base and vertebrae: Limited evaluation for fracture C4 and below due to motion Soft tissues and spinal canal: Normal prevertebral soft tissue thickness Disc levels:  Limited evaluation due to motion Upper chest: Negative Other: None IMPRESSION: 1. Motion degraded images of the brain. No definite CT evidence for acute intracranial abnormality 2. Very limited evaluation of the cervical spine due to patient motion. Excessive motion C4 and below hinders evaluation of cervical alignment and the presence of fractures. Electronically Signed   By: Jasmine PangKim  Fujinaga M.D.   On: 03/23/2018 21:08   Dg Chest Port 1 View  Result Date: 03/23/2018 CLINICAL DATA:  Altered mental status EXAM: PORTABLE CHEST 1 VIEW COMPARISON:  None. FINDINGS: The heart size and mediastinal contours are within normal limits. Both lungs are clear. The visualized skeletal structures are unremarkable. IMPRESSION: No active disease. Electronically Signed   By: Jasmine PangKim  Fujinaga M.D.   On: 03/23/2018 19:13    Pamella Pertostin Danielle Lento, MD, PhD [facility]  Contact via  [email or web address]  [facility] Office Info P: [phone]  F: [phone]

## 2018-03-25 ENCOUNTER — Other Ambulatory Visit: Payer: Self-pay | Admitting: Registered Nurse

## 2018-03-25 ENCOUNTER — Inpatient Hospital Stay (HOSPITAL_COMMUNITY)
Admission: AD | Admit: 2018-03-25 | Discharge: 2018-03-29 | DRG: 885 | Disposition: A | Discharge status: Home / Self Care | Payer: Federal, State, Local not specified - Other | Attending: Psychiatry | Admitting: Psychiatry

## 2018-03-25 ENCOUNTER — Other Ambulatory Visit: Payer: Self-pay

## 2018-03-25 ENCOUNTER — Encounter (HOSPITAL_COMMUNITY): Payer: Self-pay

## 2018-03-25 DIAGNOSIS — E119 Type 2 diabetes mellitus without complications: Secondary | ICD-10-CM | POA: Diagnosis present

## 2018-03-25 DIAGNOSIS — F322 Major depressive disorder, single episode, severe without psychotic features: Secondary | ICD-10-CM | POA: Diagnosis not present

## 2018-03-25 DIAGNOSIS — F1590 Other stimulant use, unspecified, uncomplicated: Secondary | ICD-10-CM | POA: Diagnosis present

## 2018-03-25 DIAGNOSIS — G9340 Encephalopathy, unspecified: Principal | ICD-10-CM

## 2018-03-25 DIAGNOSIS — F419 Anxiety disorder, unspecified: Secondary | ICD-10-CM | POA: Diagnosis present

## 2018-03-25 DIAGNOSIS — F17210 Nicotine dependence, cigarettes, uncomplicated: Secondary | ICD-10-CM | POA: Diagnosis present

## 2018-03-25 DIAGNOSIS — F1910 Other psychoactive substance abuse, uncomplicated: Secondary | ICD-10-CM

## 2018-03-25 DIAGNOSIS — F339 Major depressive disorder, recurrent, unspecified: Principal | ICD-10-CM | POA: Diagnosis present

## 2018-03-25 DIAGNOSIS — T68XXXD Hypothermia, subsequent encounter: Secondary | ICD-10-CM

## 2018-03-25 DIAGNOSIS — O165 Unspecified maternal hypertension, complicating the puerperium: Secondary | ICD-10-CM | POA: Diagnosis present

## 2018-03-25 DIAGNOSIS — F29 Unspecified psychosis not due to a substance or known physiological condition: Secondary | ICD-10-CM | POA: Diagnosis present

## 2018-03-25 DIAGNOSIS — F1290 Cannabis use, unspecified, uncomplicated: Secondary | ICD-10-CM | POA: Diagnosis present

## 2018-03-25 DIAGNOSIS — F332 Major depressive disorder, recurrent severe without psychotic features: Secondary | ICD-10-CM | POA: Diagnosis not present

## 2018-03-25 DIAGNOSIS — Z590 Homelessness: Secondary | ICD-10-CM | POA: Diagnosis not present

## 2018-03-25 DIAGNOSIS — F24 Shared psychotic disorder: Secondary | ICD-10-CM

## 2018-03-25 DIAGNOSIS — F19959 Other psychoactive substance use, unspecified with psychoactive substance-induced psychotic disorder, unspecified: Secondary | ICD-10-CM | POA: Diagnosis present

## 2018-03-25 DIAGNOSIS — F1410 Cocaine abuse, uncomplicated: Secondary | ICD-10-CM | POA: Diagnosis present

## 2018-03-25 DIAGNOSIS — R4182 Altered mental status, unspecified: Secondary | ICD-10-CM

## 2018-03-25 LAB — CBC WITH DIFFERENTIAL/PLATELET
Abs Immature Granulocytes: 0.01 10*3/uL (ref 0.00–0.07)
Basophils Absolute: 0.1 10*3/uL (ref 0.0–0.1)
Basophils Relative: 1 %
EOS ABS: 0.2 10*3/uL (ref 0.0–0.5)
Eosinophils Relative: 3 %
HEMATOCRIT: 38.1 % (ref 36.0–46.0)
Hemoglobin: 12 g/dL (ref 12.0–15.0)
Immature Granulocytes: 0 %
LYMPHS ABS: 2.8 10*3/uL (ref 0.7–4.0)
Lymphocytes Relative: 45 %
MCH: 29.7 pg (ref 26.0–34.0)
MCHC: 31.5 g/dL (ref 30.0–36.0)
MCV: 94.3 fL (ref 80.0–100.0)
MONOS PCT: 5 %
Monocytes Absolute: 0.3 10*3/uL (ref 0.1–1.0)
Neutro Abs: 2.9 10*3/uL (ref 1.7–7.7)
Neutrophils Relative %: 46 %
PLATELETS: 328 10*3/uL (ref 150–400)
RBC: 4.04 MIL/uL (ref 3.87–5.11)
RDW: 12.2 % (ref 11.5–15.5)
WBC: 6.2 10*3/uL (ref 4.0–10.5)
nRBC: 0 % (ref 0.0–0.2)

## 2018-03-25 LAB — HIV ANTIBODY (ROUTINE TESTING W REFLEX): HIV Screen 4th Generation wRfx: NONREACTIVE

## 2018-03-25 MED ORDER — NICOTINE POLACRILEX 2 MG MT GUM
2.0000 mg | CHEWING_GUM | OROMUCOSAL | Status: DC | PRN
  Administered 2018-03-28 – 2018-03-29 (×5): 2 mg via ORAL
  Filled 2018-03-25 (×3): qty 1

## 2018-03-25 NOTE — Tx Team (Addendum)
Initial Treatment Plan 03/25/2018 8:28 PM Laura Wagner IPJ:[id number]    PATIENT STRESSORS: Financial difficulties Marital or family conflict Substance abuse   PATIENT STRENGTHS: General fund of knowledge Physical Health   PATIENT IDENTIFIED PROBLEMS: "Depression"  "Anxiety"   "Poly substance-use"   "Homeless"                DISCHARGE CRITERIA:  Ability to meet basic life and health needs Improved stabilization in mood, thinking, and/or behavior Withdrawal symptoms are absent or subacute and managed without 24-hour nursing intervention  PRELIMINARY DISCHARGE PLAN: Attend PHP/IOP Attend 12-step recovery group Outpatient therapy Placement in alternative living arrangements  PATIENT/FAMILY INVOLVEMENT: This treatment plan has been presented to and reviewed with the patient, Laura Wagner. The patient have been given the opportunity to ask questions and make suggestions.  Tyrone Apple, RN 03/25/2018, 8:28 PM

## 2018-03-25 NOTE — Progress Notes (Signed)
Patient slept majority of the shift. She did arouse enough to drink some coke and ate one peanut butter cracker. Safety sitter in all shift.

## 2018-03-25 NOTE — Progress Notes (Signed)
Pt accepted to [facility], Bed 306-2 Shuvon Rankin, NP is the accepting provider.  Nehemiah Massed, MD is the attending provider.  Call report to [phone]  [email or web address] notified.   Pt is IVC  Pt may be transported by [facility] Pt scheduled  to arrive at [facility] as soon as transport can be arranged.  Timmothy Euler. Kaylyn Lim, MSW, LCSWA Disposition Clinical Social Work [phone] (cell) [phone] (office)  Requested that IVC be faxed to [phone]

## 2018-03-25 NOTE — Progress Notes (Addendum)
Admission Note:   [name] is an 23 y.o. female who presents IVC for substance-abuse. Pt appears to be thought blocking during admission questions. Pt was a poor historian.Per report, EMS says patient was found unresponsive on a bench at a convenience store. Deputy arrived and gave her 2 mg of Narcan. Patient recently released from jail for FTA. Patient told deputy that she'd been taking pills. Patient shares that she has a 2 month old daughter; Pt state she lost custody. Patient was unable to provide any mental health history. Patient states she lives in [place]. She is currently living in a car (Homeless) with her boyfriend who also uses methamphetamines. Skin was assessed and found to be clear of any abnormal marks apart. Pt searched and no contraband found, POC and unit policies explained and understanding verbalized. Consents obtained. Food and fluids offered, and both accepted. Pt had no additional questions or concerns.

## 2018-03-25 NOTE — Discharge Summary (Signed)
Physician Discharge Summary  Laura Wagner PJK:[id number] DOB: 1995/09/14 DOA: 03/23/2018  PCP: Patient, No Pcp Per  Admit date: 03/23/2018 Discharge date: 03/25/2018  Admitted From: home Disposition: [facility]  Recommendations for Outpatient Follow-up:  1. Follow up with PCP in 1-2 weeks  Home Health: none Equipment/Devices: none  Discharge Condition: stable CODE STATUS: Full code Diet recommendation: regular  HPI: Per admitting MD, Laura Wagner is a 23 y.o. female with medical history significant of polysubstance abuse, postpartum hypertension who was found laying on advantage at a convenience store.  She was given 2 mg of Narcan nasally by a Conservator, museum/gallery.  EMS who also goes and seen and reported that the patient became more alert and was combative.  She was recently released from jail.  She told her that daily that she has been taking pills.  She was very restless in the emergency department and was given Haldol and Ativan.  She is currently sedated and unable to provide further history. ED Course: Initial temperature was 92 F, pulse 134, respirations 21, blood pressure 125/95 mmHg and O2 sat 100% on room air.  The patient was given a 1000 mL NS bolus, Haldol and started on a Narcan infusion in the emergency department. White count 7.3, hemoglobin 13.9 g/dL and platelets 809.  Venous blood gas show a decreased PO2, but was otherwise normal.  CMP shows a calcium of 8.7 mg/dL, all other values within normal limits.  Ammonia was 21 mol/L.  Urinalysis showed a hazy appearance, but was otherwise normal.  UDS was positive for cocaine, benzodiazepines and amphetamines.  Hospital Course: Acute encephalopathy likely in the setting of polysubstance abuse -Patient apparently responded to Narcan and was started on a Narcan drip on admission.  Urine drug screen was positive for cocaine, benzodiazepines as well as amphetamines, but not show any opiates.  Her Narcan infusion was discontinued.   She slowly recovered and currently has returned to baseline, alert and oriented x4.  Psychiatry was consulted and evaluated patient, recommending inpatient psychiatric hospitalization due to concern for suicidal ideation after discussing with patient's mother.  She refused admission therefore IVC paperwork was completed.  She has a bed at behavioral health and will be transferred for further treatment.  Active Problems Mild anemia -Stable, no evidence of bleeding, Hypertension -patient noted to be on amlodipine prior to admission however blood pressure here has been low normal.  Hold further antihypertensives, they can be resumed as an outpatient this clinically indicated. Depression/history of suicide attempt -patient did complain of depression and stated she had a suicide attempt about a year ago.  Further treatment per psychiatry   Discharge Diagnoses:  Principal Problem:   Altered mental status Active Problems:   Polysubstance abuse (HCC)   Hypothermia     Discharge Instructions   Allergies as of 03/25/2018   No Known Allergies     Medication List    STOP taking these medications   amLODipine 10 MG tablet Commonly known as:  NORVASC   ibuprofen 600 MG tablet Commonly known as:  ADVIL,MOTRIN     TAKE these medications   medroxyPROGESTERone 150 MG/ML injection Commonly known as:  DEPO-PROVERA Inject 1 mL (150 mg total) every 3 (three) months into the muscle.   Prenatal Vitamins 28-0.8 MG Tabs Take 1 tablet by mouth daily.        Consultations:  Psychiatry   Procedures/Studies:  Ct Head Wo Contrast  Result Date: 03/23/2018 CLINICAL DATA:  Altered LOC EXAM: CT HEAD WITHOUT CONTRAST  CT CERVICAL SPINE WITHOUT CONTRAST TECHNIQUE: Multidetector CT imaging of the head and cervical spine was performed following the standard protocol without intravenous contrast. Multiplanar CT image reconstructions of the cervical spine were also generated. COMPARISON:  None. FINDINGS:  CT HEAD FINDINGS Brain: No acute territorial infarction, hemorrhage or intracranial mass. The ventricles are nonenlarged. Motion degraded study Vascular: No hyperdense vessel or unexpected calcification. Skull: Normal. Negative for fracture or focal lesion. Sinuses/Orbits: No acute finding. Other: None CT CERVICAL SPINE FINDINGS Alignment: Straightening of the upper cervical vertebral bodies. Rotated appearance of C1 on C2 presumably due to head position. Excessive motion C4 and below hinders evaluation of the alignment as well as for fracture abnormality. Skull base and vertebrae: Limited evaluation for fracture C4 and below due to motion Soft tissues and spinal canal: Normal prevertebral soft tissue thickness Disc levels:  Limited evaluation due to motion Upper chest: Negative Other: None IMPRESSION: 1. Motion degraded images of the brain. No definite CT evidence for acute intracranial abnormality 2. Very limited evaluation of the cervical spine due to patient motion. Excessive motion C4 and below hinders evaluation of cervical alignment and the presence of fractures. Electronically Signed   By: Jasmine PangKim  Fujinaga M.D.   On: 03/23/2018 21:08   Ct Cervical Spine Wo Contrast  Result Date: 03/23/2018 CLINICAL DATA:  Altered LOC EXAM: CT HEAD WITHOUT CONTRAST CT CERVICAL SPINE WITHOUT CONTRAST TECHNIQUE: Multidetector CT imaging of the head and cervical spine was performed following the standard protocol without intravenous contrast. Multiplanar CT image reconstructions of the cervical spine were also generated. COMPARISON:  None. FINDINGS: CT HEAD FINDINGS Brain: No acute territorial infarction, hemorrhage or intracranial mass. The ventricles are nonenlarged. Motion degraded study Vascular: No hyperdense vessel or unexpected calcification. Skull: Normal. Negative for fracture or focal lesion. Sinuses/Orbits: No acute finding. Other: None CT CERVICAL SPINE FINDINGS Alignment: Straightening of the upper cervical  vertebral bodies. Rotated appearance of C1 on C2 presumably due to head position. Excessive motion C4 and below hinders evaluation of the alignment as well as for fracture abnormality. Skull base and vertebrae: Limited evaluation for fracture C4 and below due to motion Soft tissues and spinal canal: Normal prevertebral soft tissue thickness Disc levels:  Limited evaluation due to motion Upper chest: Negative Other: None IMPRESSION: 1. Motion degraded images of the brain. No definite CT evidence for acute intracranial abnormality 2. Very limited evaluation of the cervical spine due to patient motion. Excessive motion C4 and below hinders evaluation of cervical alignment and the presence of fractures. Electronically Signed   By: Jasmine PangKim  Fujinaga M.D.   On: 03/23/2018 21:08   Dg Chest Port 1 View  Result Date: 03/23/2018 CLINICAL DATA:  Altered mental status EXAM: PORTABLE CHEST 1 VIEW COMPARISON:  None. FINDINGS: The heart size and mediastinal contours are within normal limits. Both lungs are clear. The visualized skeletal structures are unremarkable. IMPRESSION: No active disease. Electronically Signed   By: Jasmine PangKim  Fujinaga M.D.   On: 03/23/2018 19:13      Subjective: - no chest pain, shortness of breath, no abdominal pain, nausea or vomiting.  Upset that she is here  Discharge Exam: Vitals:   03/25/18 1100 03/25/18 1127  BP: 98/75   Pulse: 96 92  Resp: 12 14  Temp:  (!) 97.4 F (36.3 C)  SpO2: 100% 100%    General: Pt is alert, awake, not in acute distress Cardiovascular: RRR, S1/S2 +, no rubs, no gallops Respiratory: CTA bilaterally, no wheezing, no rhonchi Abdominal: Soft, NT,  ND, bowel sounds + Extremities: no edema, no cyanosis    The results of significant diagnostics from this hospitalization (including imaging, microbiology, ancillary and laboratory) are listed below for reference.     Microbiology: Recent Results (from the past 240 hour(s))  MRSA PCR Screening     Status: None     Collection Time: 03/24/18 12:38 AM  Result Value Ref Range Status   MRSA by PCR NEGATIVE NEGATIVE Final    Comment:        The GeneXpert MRSA Assay (FDA approved for NASAL specimens only), is one component of a comprehensive MRSA colonization surveillance program. It is not intended to diagnose MRSA infection nor to guide or monitor treatment for MRSA infections. Performed at [facility], [address], [place], [place] [address]      Labs: BNP (last 3 results) No results for input(s): BNP in the last 8760 hours. Basic Metabolic Panel: Recent Labs  Lab 03/23/18 1846 03/24/18 0107  NA 137 139  K 3.9 3.4*  CL 107 113*  CO2 24 23  GLUCOSE 89 72  BUN 10 9  CREATININE 0.50 0.59  CALCIUM 8.7* 8.1*   Liver Function Tests: Recent Labs  Lab 03/23/18 1846 03/24/18 0107  AST 17 16  ALT 12 8  ALKPHOS 64 49  BILITOT 0.6 0.4  PROT 7.0 5.6*  ALBUMIN 4.1 3.4*   No results for input(s): LIPASE, AMYLASE in the last 168 hours. Recent Labs  Lab 03/23/18 1847  AMMONIA 21   CBC: Recent Labs  Lab 03/23/18 1846 03/24/18 0107 03/25/18 0439  WBC 7.3 8.5 6.2  NEUTROABS 4.9 5.0 2.9  HGB 13.9 11.4* 12.0  HCT 42.8 35.6* 38.1  MCV 91.3 93.2 94.3  PLT 367 334 328   Cardiac Enzymes: No results for input(s): CKTOTAL, CKMB, CKMBINDEX, TROPONINI in the last 168 hours. BNP: Invalid input(s): POCBNP CBG: No results for input(s): GLUCAP in the last 168 hours. D-Dimer No results for input(s): DDIMER in the last 72 hours. Hgb A1c No results for input(s): HGBA1C in the last 72 hours. Lipid Profile No results for input(s): CHOL, HDL, LDLCALC, TRIG, CHOLHDL, LDLDIRECT in the last 72 hours. Thyroid function studies No results for input(s): TSH, T4TOTAL, T3FREE, THYROIDAB in the last 72 hours.  Invalid input(s): FREET3 Anemia work up No results for input(s): VITAMINB12, FOLATE, FERRITIN, TIBC, IRON, RETICCTPCT in the last 72 hours. Urinalysis    Component Value Date/Time    COLORURINE YELLOW 03/23/2018 1846   APPEARANCEUR HAZY (A) 03/23/2018 1846   LABSPEC 1.021 03/23/2018 1846   PHURINE 7.0 03/23/2018 1846   GLUCOSEU NEGATIVE 03/23/2018 1846   HGBUR NEGATIVE 03/23/2018 1846   BILIRUBINUR NEGATIVE 03/23/2018 1846   KETONESUR NEGATIVE 03/23/2018 1846   PROTEINUR NEGATIVE 03/23/2018 1846   NITRITE NEGATIVE 03/23/2018 1846   LEUKOCYTESUR NEGATIVE 03/23/2018 1846   Sepsis Labs Invalid input(s): PROCALCITONIN,  WBC,  LACTICIDVEN   Time coordinating discharge: 35 minutes  SIGNED:  Pamella Pert, MD  [facility] 03/25/2018, 3:32 PM

## 2018-03-26 DIAGNOSIS — F24 Shared psychotic disorder: Secondary | ICD-10-CM

## 2018-03-26 DIAGNOSIS — F19959 Other psychoactive substance use, unspecified with psychoactive substance-induced psychotic disorder, unspecified: Secondary | ICD-10-CM | POA: Diagnosis present

## 2018-03-26 DIAGNOSIS — F332 Major depressive disorder, recurrent severe without psychotic features: Secondary | ICD-10-CM

## 2018-03-26 MED ORDER — GABAPENTIN 100 MG PO CAPS
100.0000 mg | ORAL_CAPSULE | Freq: Three times a day (TID) | ORAL | Status: DC
  Administered 2018-03-26 – 2018-03-29 (×10): 100 mg via ORAL
  Filled 2018-03-26 (×16): qty 1

## 2018-03-26 MED ORDER — MAGNESIUM HYDROXIDE 400 MG/5ML PO SUSP: 30.0000 mL | Freq: Every day | ORAL | Status: DC | PRN

## 2018-03-26 MED ORDER — ALUM & MAG HYDROXIDE-SIMETH 200-200-20 MG/5ML PO SUSP: 30.0000 mL | ORAL | Status: DC | PRN

## 2018-03-26 MED ORDER — HYDROXYZINE HCL 25 MG PO TABS: 25.0000 mg | ORAL_TABLET | Freq: Three times a day (TID) | ORAL | Status: DC | PRN

## 2018-03-26 MED ORDER — ACETAMINOPHEN 325 MG PO TABS: 650.0000 mg | ORAL_TABLET | Freq: Four times a day (QID) | ORAL | Status: DC | PRN

## 2018-03-26 NOTE — Progress Notes (Signed)
D: Pt denies SI/HI/AVH. Pt is pleasant and cooperative. Pt visible on the unit this evening, pt appears to minimize her situation this evening.  Pt stated she was better due to sleeping and staying focused in here. A: Pt was offered support and encouragement.  Pt was encourage to attend groups. Q 15 minute checks were done for safety.  R:Pt attends groups and interacts well with peers and staff. Pt is taking medication. Pt has no complaints.Pt receptive to treatment and safety maintained on unit.  Problem: Education: Goal: Mental status will improve Outcome: Progressing   Problem: Activity: Goal: Sleeping patterns will improve Outcome: Progressing

## 2018-03-26 NOTE — H&P (Signed)
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 Laura Wagner is 23 years of age she has a history of polysubstance abuse and dependencies, she was actually found unconscious at a convenience store and Narcan was administered however her drug screen showed amphetamines, benzodiazepines and cocaine by the time she came to our attention, when she was alert she became combative.  She gives me a variable history-story basically changing as I interview her  At first she told me she was walking to work got tired and was in her car sleeping when the police arrived, later she tells me she does not work and that she was simply walking from [place] to "see her old man"  At any rate she continues to deny that she is a substance abuser despite being confronted with a positive drug screen at her history.  She also acknowledges losing custody of her children due to drug use but then paradoxically denies a history of substance abuse.  According to the emergency room HPI   Laura Wagner is a 23 y.o. female with medical history significant of polysubstance abuse, postpartum hypertension who was found laying on advantage at a convenience store.  She was given 2 mg of Narcan nasally by a Conservator, museum/gallerydeputy.  EMS who also goes and seen and reported that the patient became more alert and was combative.  She was recently released from jail.  She told her that daily that she has been taking pills.  She was very restless in the emergency department and was given Haldol and Ativan.  She is currently sedated and unable to provide further history.  ED Course: Initial  temperature was 92 F, pulse 134, respirations 21, blood pressure 125/95 mmHg and O2 sat 100% on room air.  The patient was given a 1000 mL NS bolus, Haldol and started on a Narcan infusion in the emergency department.  White count 7.3, hemoglobin 13.9 g/dL and platelets 295367.  Venous blood gas show a decreased PO2, but was otherwise normal.  CMP shows a calcium of 8.7 mg/dL, all other values within normal limits.  Ammonia was 21 mol/L.  Urinalysis showed a hazy appearance, but was otherwise normal.  UDS was positive for cocaine, benzodiazepines and amphetamines.  Associated Signs/Symptoms: Depression Symptoms:  psychomotor agitation, (Hypo) Manic Symptoms:  Distractibility, Anxiety Symptoms:  Excessive Worry, Psychotic Symptoms:  Disorganization due to polysubstance impairment PTSD Symptoms: NA Total Time spent with patient: 30 minutes  Is the patient at risk to self? Yes.    Has the patient been a risk to self in the past 6 months? Yes.    Has the patient been a risk to self within the distant past? No.  Is the patient a risk to others? No.  Has the patient been a risk to others in the past 6 months? No.  Has the patient been a risk to others within the distant past? No.   Prior Inpatient Therapy:   Prior Outpatient Therapy:    Alcohol Screening: 1. How often do you have a drink containing alcohol?: Never 2. How many drinks containing alcohol do you have on a typical day when you are drinking?: 1 or 2 3. How often do you have  six or more drinks on one occasion?: Never AUDIT-C Score: 0 4. How often during the last year have you found that you were not able to stop drinking once you had started?: Never 5. How often during the last year have you failed to do what was normally expected from you becasue of drinking?: Never 6. How often during the last year have you needed a first drink in the morning to get yourself going after a heavy drinking session?: Never 7. How often during the last  year have you had a feeling of guilt of remorse after drinking?: Never 8. How often during the last year have you been unable to remember what happened the night before because you had been drinking?: Never 9. Have you or someone else been injured as a result of your drinking?: No 10. Has a relative or friend or a doctor or another health worker been concerned about your drinking or suggested you cut down?: No Alcohol Use Disorder Identification Test Final Score (AUDIT): 0 Substance Abuse History in the last 12 months:  Yes.   Consequences of Substance Abuse: Medical Consequences:  This admission is prompted by the medical consequences of polysubstance intoxication Previous Psychotropic Medications: Yes  Psychological Evaluations: No  Past Medical History:  Past Medical History:  Diagnosis Date  . Medical history non-contributory     Past Surgical History:  Procedure Laterality Date  . NO PAST SURGERIES     Family History:  Family History  Problem Relation Age of Onset  . Cancer Maternal Grandmother        breast   Family Psychiatric  History: Patient states some family members have drug problems or cannot be more specific Tobacco Screening: Have you used any form of tobacco in the last 30 days? (Cigarettes, Smokeless Tobacco, Cigars, and/or Pipes): Yes Tobacco use, Select all that apply: 4 or less cigarettes per day Are you interested in Tobacco Cessation Medications?: Yes, will notify MD for an order Counseled patient on smoking cessation including recognizing danger situations, developing coping skills and basic information about quitting provided: Yes Social History:  Social History   Substance and Sexual Activity  Alcohol Use No     Social History   Substance and Sexual Activity  Drug Use Yes  . Types: Amphetamines, Marijuana    Additional Social-patient thought to be homeless living in her car Allergies:  No Known Allergies Lab Results:  Results for orders placed or  performed during the hospital encounter of 03/23/18 (from the past 48 hour(s))  CBC with Differential/Platelet     Status: None   Collection Time: 03/25/18  4:39 AM  Result Value Ref Range   WBC 6.2 4.0 - 10.5 K/uL   RBC 4.04 3.87 - 5.11 MIL/uL   Hemoglobin 12.0 12.0 - 15.0 g/dL   HCT 16.1 09.6 - 04.5 %   MCV 94.3 80.0 - 100.0 fL   MCH 29.7 26.0 - 34.0 pg   MCHC 31.5 30.0 - 36.0 g/dL   RDW 40.9 81.1 - 91.4 %   Platelets 328 150 - 400 K/uL   nRBC 0.0 0.0 - 0.2 %   Neutrophils Relative % 46 %   Neutro Abs 2.9 1.7 - 7.7 K/uL   Lymphocytes Relative 45 %   Lymphs Abs 2.8 0.7 - 4.0 K/uL   Monocytes Relative 5 %   Monocytes Absolute 0.3 0.1 - 1.0 K/uL   Eosinophils Relative 3 %   Eosinophils Absolute 0.2 0.0 - 0.5 K/uL   Basophils Relative  1 %   Basophils Absolute 0.1 0.0 - 0.1 K/uL   Immature Granulocytes 0 %   Abs Immature Granulocytes 0.01 0.00 - 0.07 K/uL    Comment: Performed at [facility], [address], [place], [place] [address]    Blood Alcohol level:  Lab Results  Component Value Date   ETH <10 03/23/2018    Metabolic Disorder Labs:  No results found for: HGBA1C, MPG No results found for: PROLACTIN No results found for: CHOL, TRIG, HDL, CHOLHDL, VLDL, LDLCALC  Current Medications: Current Facility-Administered Medications  Medication Dose Route Frequency Provider Last Rate Last Dose  . acetaminophen (TYLENOL) tablet 650 mg  650 mg Oral Q6H PRN Malvin Johns, MD      . alum & mag hydroxide-simeth (MAALOX/MYLANTA) 200-200-20 MG/5ML suspension 30 mL  30 mL Oral Q4H PRN Malvin Johns, MD      . gabapentin (NEURONTIN) capsule 100 mg  100 mg Oral TID Malvin Johns, MD      . hydrOXYzine (ATARAX/VISTARIL) tablet 25 mg  25 mg Oral TID PRN Malvin Johns, MD      . magnesium hydroxide (MILK OF MAGNESIA) suspension 30 mL  30 mL Oral Daily PRN Malvin Johns, MD      . nicotine polacrilex (NICORETTE) gum 2 mg  2 mg Oral PRN Cobos, Rockey Situ, MD       PTA Medications: No  medications prior to admission.    Musculoskeletal: Strength & Muscle Tone: flaccid Gait & Station: unsteady Patient leans: N/A  Psychiatric Specialty Exam: Physical Exam  ROS  Blood pressure 105/79, pulse (!) 116, temperature 99.1 F (37.3 C), temperature source Oral, resp. rate 18, height 4\' 11"  (1.499 m), weight 45.8 kg, not currently breastfeeding.Body mass index is 20.4 kg/m.  General Appearance: Casual  Eye Contact:  Minimal  Speech:  Slow and Slurred  Volume:  Decreased  Mood:  Dysphoric  Affect:  Blunt  Thought Process:  Irrelevant  Orientation:  Full (Time, Place, and Person)  Thought Content:  Illogical  Suicidal Thoughts:  No  Homicidal Thoughts:  No  Memory:  Immediate;   Good  Judgement:  Impaired  Insight:  Lacking  Psychomotor Activity:  Decreased  Concentration:  Concentration: Poor  Recall:  Poor  Fund of Knowledge:  Fair  Language:  Good  Akathisia:  Negative  Handed:  Right  AIMS (if indicated):     Assets:  Resilience  ADL's:  Intact  Cognition:  WNL  Sleep:  Number of Hours: 6    Treatment Plan Summary: Daily contact with patient to assess and evaluate symptoms and progress in treatment, Medication management and Plan Continue to monitor for withdrawal add low-dose gabapentin to protect from withdrawal symptoms off label continue to engage in reality based therapy and rehab based therapy continue to seek diagnostic clarity and clarify history  Observation Level/Precautions:  15 minute checks  Laboratory:  UDS  Psychotherapy: Cognitive-based reality based rehab based  Medications: Gabapentin  Consultations: Not necessary  Discharge Concerns: Patient's denial of substance abuse issues is very problematic therefore long-term sobriety will be an issue  Estimated LOS: Through the weekend  Other: Again patient so eager to deny all symptoms and issues that she is in denial regarding her substance abuse are flat out just being dishonest about her  substance abuse and dependency issues but this polysubstance intoxication led to a disorganized state and combative state needing further evaluation   Physician Treatment Plan for Primary Diagnosis: <principal problem not specified> Long Term Goal(s):  Improvement in symptoms so as ready for discharge  Short Term Goals: Ability to identify changes in lifestyle to reduce recurrence of condition will improve, Ability to disclose and discuss suicidal ideas and Ability to demonstrate self-control will improve  Physician Treatment Plan for Secondary Diagnosis: Active Problems:   MDD (major depressive disorder), severe (HCC)   Drug-induced intensive care psychosis (HCC)  Long Term Goal(s): Improvement in symptoms so as ready for discharge  Short Term Goals: Ability to maintain clinical measurements within normal limits will improve and Compliance with prescribed medications will improve  I certify that inpatient services furnished can reasonably be expected to improve the patient's condition.    Malvin JohnsFARAH,Carless Slatten, MD 1/10/20209:51 AM

## 2018-03-26 NOTE — [facility] Suicide Risk Assessment (Signed)
[facility] Admission Suicide Risk Assessment   Nursing information obtained from:    Demographic factors:  Adolescent or young adult, Unemployed, Low socioeconomic status, Caucasian Current Mental Status:  NA Loss Factors:  Financial problems / change in socioeconomic status Historical Factors:  NA Risk Reduction Factors:  NA  Total Time spent with patient: 30 minutes Principal Problem: Polysubstance intoxication and related impairment/psychosis Diagnosis:  Active Problems:   MDD (major depressive disorder), severe (HCC)   Drug-induced intensive care psychosis (HCC)  Subjective Data: Patient found unresponsive drug screen positive for amphetamines benzodiazepines and cocaine despite this denied drug use on initial evaluation  Continued Clinical Symptoms:  Alcohol Use Disorder Identification Test Final Score (AUDIT): 0 The "Alcohol Use Disorders Identification Test", Guidelines for Use in Primary Care, Second Edition.  World Science writer [facility]). Score between 0-7:  no or low risk or alcohol related problems. Score between 8-15:  moderate risk of alcohol related problems. Score between 16-19:  high risk of alcohol related problems. Score 20 or above:  warrants further diagnostic evaluation for alcohol dependence and treatment.   CLINICAL FACTORS:   Alcohol/Substance Abuse/Dependencies   COGNITIVE FEATURES THAT CONTRIBUTE TO RISK:  Thought constriction (tunnel vision)    SUICIDE RISK:   Minimal: No identifiable suicidal ideation.  Patients presenting with no risk factors but with morbid ruminations; may be classified as minimal risk based on the severity of the depressive symptoms  PLAN OF CARE: Monitor for withdrawal, greatest risk of suicide would be unintentional overdose to this patient  I certify that inpatient services furnished can reasonably be expected to improve the patient's condition.   Malvin Johns, MD 03/26/2018, 9:49 AM

## 2018-03-26 NOTE — Plan of Care (Signed)
  Problem: Education: Goal: Knowledge of [facility] Education information/materials will improve Outcome: Not Progressing   

## 2018-03-26 NOTE — Progress Notes (Signed)
Recreation Therapy Notes  Date: 1.10.20 Time: 0930 Location: [address] Dayroom  Group Topic: Stress Management  Goal Area(s) Addresses:  Patient will identify ways to cope with stress.  Patient will identify stress management techniques. Patient will identify benefits of using stress management post d/c.  Intervention: Stress Management  Activity :  Meditation.  LRT introduced the stress management technique of meditation.  LRT played a meditation that allowed patients to focus on resiliency. Patients were to follow along as meditation played in order to engage in activity.  Education:  Stress Management, Discharge Planning.   Education Outcome: Acknowledges Education  Clinical Observations/Feedback: Pt did not attend group.    Caroll Rancher, LRT/CTRS         Caroll Rancher A 03/26/2018 11:41 AM

## 2018-03-26 NOTE — Tx Team (Signed)
Interdisciplinary Treatment and Diagnostic Plan Update  03/26/2018 Time of Session: 9:00am Laura Wagner MRN: [id number]  Principal Diagnosis: <principal problem not specified>  Secondary Diagnoses: Active Problems:   MDD (major depressive disorder), severe (HCC)   Drug-induced intensive care psychosis (HCC)   Current Medications:  Current Facility-Administered Medications  Medication Dose Route Frequency Provider Last Rate Last Dose  . acetaminophen (TYLENOL) tablet 650 mg  650 mg Oral Q6H PRN Malvin Johns, MD      . alum & mag hydroxide-simeth (MAALOX/MYLANTA) 200-200-20 MG/5ML suspension 30 mL  30 mL Oral Q4H PRN Malvin Johns, MD      . gabapentin (NEURONTIN) capsule 100 mg  100 mg Oral TID Malvin Johns, MD      . hydrOXYzine (ATARAX/VISTARIL) tablet 25 mg  25 mg Oral TID PRN Malvin Johns, MD      . magnesium hydroxide (MILK OF MAGNESIA) suspension 30 mL  30 mL Oral Daily PRN Malvin Johns, MD      . nicotine polacrilex (NICORETTE) gum 2 mg  2 mg Oral PRN Cobos, Rockey Situ, MD       PTA Medications: No medications prior to admission.    Patient Stressors: Financial difficulties Marital or family conflict Substance abuse  Patient Strengths: General fund of knowledge Physical Health  Treatment Modalities: Medication Management, Group therapy, Case management,  1 to 1 session with clinician, Psychoeducation, Recreational therapy.   Physician Treatment Plan for Primary Diagnosis: <principal problem not specified> Long Term Goal(s): Improvement in symptoms so as ready for discharge Improvement in symptoms so as ready for discharge   Short Term Goals: Ability to identify changes in lifestyle to reduce recurrence of condition will improve Ability to disclose and discuss suicidal ideas Ability to demonstrate self-control will improve Ability to maintain clinical measurements within normal limits will improve Compliance with prescribed medications will improve  Medication  Management: Evaluate patient's response, side effects, and tolerance of medication regimen.  Therapeutic Interventions: 1 to 1 sessions, Unit Group sessions and Medication administration.  Evaluation of Outcomes: Progressing  Physician Treatment Plan for Secondary Diagnosis: Active Problems:   MDD (major depressive disorder), severe (HCC)   Drug-induced intensive care psychosis (HCC)  Long Term Goal(s): Improvement in symptoms so as ready for discharge Improvement in symptoms so as ready for discharge   Short Term Goals: Ability to identify changes in lifestyle to reduce recurrence of condition will improve Ability to disclose and discuss suicidal ideas Ability to demonstrate self-control will improve Ability to maintain clinical measurements within normal limits will improve Compliance with prescribed medications will improve     Medication Management: Evaluate patient's response, side effects, and tolerance of medication regimen.  Therapeutic Interventions: 1 to 1 sessions, Unit Group sessions and Medication administration.  Evaluation of Outcomes: Progressing   RN Treatment Plan for Primary Diagnosis: <principal problem not specified> Long Term Goal(s): Knowledge of disease and therapeutic regimen to maintain health will improve  Short Term Goals: Ability to participate in decision making will improve, Ability to verbalize feelings will improve, Ability to disclose and discuss suicidal ideas, Ability to identify and develop effective coping behaviors will improve and Compliance with prescribed medications will improve  Medication Management: RN will administer medications as ordered by provider, will assess and evaluate patient's response and provide education to patient for prescribed medication. RN will report any adverse and/or side effects to prescribing provider.  Therapeutic Interventions: 1 on 1 counseling sessions, Psychoeducation, Medication administration, Evaluate  responses to treatment, Monitor vital signs and CBGs as  ordered, Perform/monitor CIWA, COWS, AIMS and Fall Risk screenings as ordered, Perform wound care treatments as ordered.  Evaluation of Outcomes: Progressing   LCSW Treatment Plan for Primary Diagnosis: <principal problem not specified> Long Term Goal(s): Safe transition to appropriate next level of care at discharge, Engage patient in therapeutic group addressing interpersonal concerns.  Short Term Goals: Engage patient in aftercare planning with referrals and resources, Increase social support, Increase emotional regulation, Facilitate patient progression through stages of change regarding substance use diagnoses and concerns, Identify triggers associated with mental health/substance abuse issues and Increase skills for wellness and recovery  Therapeutic Interventions: Assess for all discharge needs, 1 to 1 time with Social worker, Explore available resources and support systems, Assess for adequacy in community support network, Educate family and significant other(s) on suicide prevention, Complete Psychosocial Assessment, Interpersonal group therapy.  Evaluation of Outcomes: Progressing   Progress in Treatment: Attending groups: No. Participating in groups: No. Taking medication as prescribed: Yes. Toleration medication: Yes. Family/Significant other contact made: No, will contact:  supports if consent is granted Patient understands diagnosis: No. Discussing patient identified problems/goals with staff: Yes. Medical problems stabilized or resolved: No. Denies suicidal/homicidal ideation: No. Issues/concerns per patient self-inventory: Yes.  New problem(s) identified: Yes, Describe:  patient homeless; reports she lives in a car with her boyfriend who uses drugs  New Short Term/Long Term Goal(s): detox, medication management for mood stabilization; elimination of SI thoughts; development of comprehensive mental wellness/sobriety  plan.  Patient Goals: Wants treatment for substance use, anxiety, and wants housing resources.  Discharge Plan or Barriers: CSW continuing to assess. [facility]AG pamphlet, Mobile Crisis information, and AA/NA information provided to patient for additional community support and resources.   Reason for Continuation of Hospitalization: Anxiety Depression Medication stabilization Suicidal ideation Withdrawal symptoms  Estimated Length of Stay: 3-5 days  Attendees: Patient: 03/26/2018 11:59 AM  Physician:  03/26/2018 11:59 AM  Nursing:  03/26/2018 11:59 AM  RN Care Manager: 03/26/2018 11:59 AM  Social Worker: Enid Cutterharlotte Christinea Brizuela, LCSWA 03/26/2018 11:59 AM  Recreational Therapist:  03/26/2018 11:59 AM  Other:  03/26/2018 11:59 AM  Other:  03/26/2018 11:59 AM  Other: 03/26/2018 11:59 AM    Scribe for Treatment Team: Darreld Mcleanharlotte C Zahi Plaskett, LCSWA 03/26/2018 11:59 AM

## 2018-03-26 NOTE — Progress Notes (Signed)
D Pt spent the majority of the morning in her bed. SHe presents with a flat, depressed and sad affect. She wears hospital- issue patient scrubs, her hygiene is poor and she does not engage in ehr recovery ( as evidenced by her presence in her room, isolating and  she does not interact with other patients or staff.     A She did complete her dialya ssessment and on this she wrote she denied having SI today and she rated her derpession, hopelessness and anxiety " 0/2/9", respectively.She went to lunch iin the cafe at 1215.     R Safety is in place.

## 2018-03-27 DIAGNOSIS — F322 Major depressive disorder, single episode, severe without psychotic features: Secondary | ICD-10-CM

## 2018-03-27 LAB — PREGNANCY, URINE: Preg Test, Ur: NEGATIVE

## 2018-03-27 NOTE — Progress Notes (Signed)
[facility] Group Notes:  (Nursing/MHT/Case Management/Adjunct)  Date:  03/27/2018  Time:  2045  Type of Therapy:  wrap up group  Participation Level:  Active  Participation Quality:  Appropriate, Attentive, Sharing and Supportive  Affect:  Flat  Cognitive:  Alert  Insight:  Improving  Engagement in Group:  Engaged  Modes of Intervention:  Clarification, Education and Support  Summary of Progress/Problems: Pt shared that her appetite was better today and attributed that to increased mood and less withdraw symptoms. If pt could change any one thing it would be to have never done drugs. Pt reports first use at age 23 or 23 years old. Pt is grateful for her 1 year and 343 month old daughter.   Marcille BuffyMcNeil, Pria Klosinski S 03/27/2018, 9:53 PM

## 2018-03-27 NOTE — Progress Notes (Signed)
D. Pt presents with a sad affect/mood- calm and cooperative, guarded behavior. Pt observed attending am group led by MHT, but stayed in am during group led by SW due to complaints of nausea. Per pt's self inventory, pt rates her depression, hopelessness and anxiety a 0/0/3, respectively. Pt writes that her most important goal today is "my daughter" and writes that she will "stay positive" in order to meet that goal.  Pt currently denies SI/HI and AV hallucinations. A. Labs and vitals monitored. Pt compliant with medications. Pt provided with ginger ale and saltines for nausea- supported emotionally and encouraged to express concerns and ask questions.   R. Pt remains safe with 15 minute checks. Will continue POC.

## 2018-03-27 NOTE — Progress Notes (Signed)
D: Pt denies SI/HI/AVH. Pt is pleasant and cooperative. Pt stated she was feeling better this evening, pt visible in dayroom much of this evening.   A: Pt was offered support and encouragement. Pt was given scheduled medications. Pt was encourage to attend groups. Q 15 minute checks were done for safety.   R:Pt attends groups and interacts well with peers and staff. Pt is taking medication. Pt has no complaints.Pt receptive to treatment and safety maintained on unit.  Problem: Education: Goal: Emotional status will improve Outcome: Progressing   Problem: Education: Goal: Mental status will improve Outcome: Progressing

## 2018-03-27 NOTE — [facility] Counselor (Signed)
Adult Comprehensive Assessment  Patient ID: Rachell Rund, female   DOB: [birth year], 23 y.o.   MRN: [id number]  Information Source: Information source: Patient  Current Stressors:  Patient states their primary concerns and needs for treatment are:: detox;  Patient states their goals for this hospitilization and ongoing recovery are:: "to stay positive and stay clean."  Physical health (include injuries & life threatening diseases): none identified Bereavement / Loss: none identified   Living/Environment/Situation:  Living Arrangements: Alone Living conditions (as described by patient or guardian): trailer Who else lives in the home?: friend How long has patient lived in current situation?: one year What is atmosphere in current home: Comfortable  Family History:  Marital status: Single Are you sexually active?: No What is your sexual orientation?: heterosexual Has your sexual activity been affected by drugs, alcohol, medication, or emotional stress?: n/a Does patient have children?: Yes How many children?: 1 How is patient's relationship with their children?:  36yr 3 mo daughter--in custody of family friend-DSS involvement. "I see her every other Monday."   Childhood History:  By whom was/is the patient raised?: Mother Additional childhood history information: Mother and stepfather raised me. "My stedad was a heavy drinker." "I don't know anything about my biological dad."  Description of patient's relationship with caregiver when they were a child: close to mom and stepdad Patient's description of current relationship with people who raised him/her: close to mom--"she lives in [place]." "She knows I'm in the hospital." divorced from stepfather. How were you disciplined when you got in trouble as a child/adolescent?: grounded; things taken away.  Does patient have siblings?: Yes Number of Siblings: 3 Description of patient's current relationship with siblings: 2 brother and 1  sister. "no issues with them." Did patient suffer any verbal/emotional/physical/sexual abuse as a child?: No Did patient suffer from severe childhood neglect?: No Has patient ever been sexually abused/assaulted/raped as an adolescent or adult?: No Was the patient ever a victim of a crime or a disaster?: No Witnessed domestic violence?: No Has patient been effected by domestic violence as an adult?: No  Education:  Highest grade of school patient has completed: graduated high school  Currently a Consulting civil engineer?: No Learning disability?: No  Employment/Work Situation:   Employment situation: Unemployed Patient's job has been impacted by current illness: Yes Describe how patient's job has been impacted: drug use gets in the way  What is the longest time patient has a held a job?: 6 months 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  If attempted suicide, did drugs/alcohol play a role in this?: Yes("I took a bottle of pills (benzos) in attempt to end my life. I was sober when I made that decision.") Alcohol/Substance Abuse Treatment Hx: Denies past history If yes, describe treatment: "never."  Has alcohol/substance abuse ever caused legal problems?: Yes(just got out of jail for FTA--original charge is 2nd degree trespassing. next court date 2/3)  Social Support System:   Patient's  Community Support System: [employer]/gallery exhibitions officerDescribe Community Support System: "I got a few people I can call if I need help." Type of faith/religion: no How does patient's faith help to cope with current illness?: n/a  Leisure/Recreation:   Leisure and Hobbies: "no hobbies."   Strengths/Needs:   What is the patient's perception of their strengths?: "I'm not sure." Patient states they can use these personal strengths during their treatment to contribute to their recovery: "My mom is pretty supportive. I have my daughter to live for."  Patient states these barriers may affect/interfere with their treatment: none identified Patient states these barriers may affect their return to the community: none identified Other important information patient would like considered in planning for their treatment: "I want to go back to where I was staying."   Discharge Plan:   Currently receiving community mental health services: No Patient states concerns and preferences for aftercare planning are: none identified.  Patient states they will know when they are safe and ready for discharge when: "I'll be ready early next week probably."  Does patient have access to transportation?: Yes("I get rides.") Does patient have financial barriers related to discharge medications?: No Patient description of barriers related to discharge medications: Medicaid but no income Will patient be returning to same living situation after discharge?: Yes("I plan to go back home." )  Summary/Recommendations:   Summary and Recommendations (to be completed by the evaluator): Patient is 22yo female [place] [facility] with her friend. Pt presents to the hospital seeking treatment for SI with attempted overdose, depression/mood lability, cocaine/amphetamine abuse, and for medication stabilization. Pt has a primary diagnosis of MDD, recurrent, severe. Pt is single, unemployed, and has a one year old daughter (in custody of  family friend). Pt denies SI/HI/AVH currently. She has upcoming court date on 2/3. Recommendations for pt include: crisis stabilization, therapeutic milieu, encourage group attendance and participation, medication management for detox/mood stabilization, and development of comprehensive mental wellness/sobriety plan. CSW assessing for appropriate referrals--pt is declining residential referrals and would like to return home at discharge. [facility].   Rona RavensHeather S Jadelynn Boylan LCSW 03/27/2018 8:50 AM

## 2018-03-27 NOTE — Progress Notes (Addendum)
[facility]  03/27/2018 1:18 PM Laura Wagner  MRN:  [id number] Subjective:  Laura Wagner is a 23 year old caucasian female who [facility] suicide attempt which she describes as taking a bottle of pills and just turning it up and swallowing them. She stated the pills were benzos she thinks. Pt's UDS positive for amphetamines, cocaine, and benzos, BAL negative. Pt stated she just got out of jail for second degree trespassing and felt like life was not worth living so she walked to a store, took the pills and then was found on the pavement, unresponsive. She stated "I don't remember a lot of what happened, I just remember waking up in the hospital and them telling me what happened." pt does not currently work and is staying with a friend. Pt also stated she has never been in jail before. Pt reported she has a daughter that is 3514 months old but she is with a friend of the family.    Pt denies suicidal/homicidal ideation, denies auditory/visual hallucinations and does not appear to be responding to internal stimuli. She denies withdrawal symptoms, except for some nausea. She appears sad and depressed with a flat, blunted affect. Her thought process is not congruent with the severity of her actions. She has some thought blocking. She stated she slept a full 8 hours without waking up last night and ate her breakfast this morning. Pt is forward thinking and states she wants to be around for her daughter and that she has been thinking about going to college to become a nurse. Pt did inquire about her urine pregnancy test and stated she has missed her second period. Her urine pregnancy test on 03/26/2018 was negative and this was conveyed to the patient.   Principal Problem: MDD (major depressive disorder), severe (HCC) Diagnosis: Active Problems:   MDD (major depressive disorder), severe (HCC)   Drug-induced intensive care psychosis (HCC)  Total Time spent with patient: 30  minutes  Past Psychiatric History: As above and see admission H&P  Past Medical History:  Past Medical History:  Diagnosis Date  . Medical history non-contributory     Past Surgical History:  Procedure Laterality Date  . NO PAST SURGERIES     Family History:  Family History  Problem Relation Age of Onset  . Cancer Maternal Grandmother        breast   Family Psychiatric  History: reports some family members have drug problems. Social History:  Social History   Substance and Sexual Activity  Alcohol Use No     Social History   Substance and Sexual Activity  Drug Use Yes  . Types: Amphetamines, Marijuana    Social History   Socioeconomic History  . Marital status: Single    Spouse name: Not on file  . Number of children: Not on file  . Years of education: Not on file  . Highest education level: Not on file  Occupational History  . Not on file  Social Needs  . Financial resource strain: Not on file  . Food insecurity:    Worry: Not on file    Inability: Not on file  . Transportation needs:    Medical: Not on file    Non-medical: Not on file  Tobacco Use  . Smoking status: Current Every Day Smoker    Packs/day: 0.25    Years: 2.00    Pack years: 0.50    Types: Cigarettes  . Smokeless tobacco: Never  Used  Substance and Sexual Activity  . Alcohol use: No  . Drug use: Yes    Types: Amphetamines, Marijuana  . Sexual activity: Yes    Birth control/protection: None  Lifestyle  . Physical activity:    Days per week: Not on file    Minutes per session: Not on file  . Stress: Not on file  Relationships  . Social connections:    Talks on phone: Not on file    Gets together: Not on file    Attends religious service: Not on file    Active member of club or organization: Not on file    Attends meetings of clubs or organizations: Not on file    Relationship status: Not on file  Other Topics Concern  . Not on file  Social History Narrative  . Not on file    Additional Social History:   Sleep: Fair  Appetite:  Fair  Current Medications: Current Facility-Administered Medications  Medication Dose Route Frequency Provider Last Rate Last Dose  . acetaminophen (TYLENOL) tablet 650 mg  650 mg Oral Q6H PRN Malvin JohnsFarah, Brian, MD      . alum & mag hydroxide-simeth (MAALOX/MYLANTA) 200-200-20 MG/5ML suspension 30 mL  30 mL Oral Q4H PRN Malvin JohnsFarah, Brian, MD      . gabapentin (NEURONTIN) capsule 100 mg  100 mg Oral TID Malvin JohnsFarah, Brian, MD   100 mg at 03/27/18 1222  . hydrOXYzine (ATARAX/VISTARIL) tablet 25 mg  25 mg Oral TID PRN Malvin JohnsFarah, Brian, MD      . magnesium hydroxide (MILK OF MAGNESIA) suspension 30 mL  30 mL Oral Daily PRN Malvin JohnsFarah, Brian, MD      . nicotine polacrilex (NICORETTE) gum 2 mg  2 mg Oral PRN Danyale Ridinger, Rockey SituFernando A, MD        Lab Results:  Results for orders placed or performed during the hospital encounter of 03/25/18 (from the past 48 hour(s))  Pregnancy, urine     Status: None   Collection Time: 03/26/18  6:51 PM  Result Value Ref Range   Preg Test, Ur NEGATIVE NEGATIVE    Comment:        THE SENSITIVITY OF THIS METHODOLOGY IS >20 mIU/mL. Performed at [facility], [address] [place], [place] [address]     Blood Alcohol level:  Lab Results  Component Value Date   ETH <10 03/23/2018    Metabolic Disorder Labs: No results found for: HGBA1C, MPG No results found for: PROLACTIN No results found for: CHOL, TRIG, HDL, CHOLHDL, VLDL, LDLCALC  Physical Findings: AIMS: Facial and Oral Movements Muscles of Facial Expression: None, normal Lips and Perioral Area: None, normal Jaw: None, normal Tongue: None, normal,Extremity Movements Upper (arms, wrists, hands, fingers): None, normal Lower (legs, knees, ankles, toes): None, normal, Trunk Movements Neck, shoulders, hips: None, normal, Overall Severity Severity of abnormal movements (highest score from questions above): None, normal Incapacitation due to abnormal  movements: None, normal Patient's awareness of abnormal movements (rate only patient's report): No Awareness, Dental Status Current problems with teeth and/or dentures?: No Does patient usually wear dentures?: No  CIWA:    COWS:     Musculoskeletal: Strength & Muscle Tone: within normal limits Gait & Station: normal Patient leans: N/A  Psychiatric Specialty Exam: Physical Exam  ROS  Blood pressure 105/79, pulse (!) 116, temperature 99.1 F (37.3 C), temperature source Oral, resp. rate 18, height 4\' 11"  (1.499 m), weight 45.8 kg, not currently breastfeeding.Body mass index is 20.4 kg/m.  General  Appearance: Casual  Eye Contact:  Good  Speech:  Normal  Volume:  Normal  Mood:  Dysphoric  Affect:  Blunt, improving  Thought Process:  Irrelevant  Orientation:  Full (Time, Place, and Person)  Thought Content:  Illogical  Suicidal Thoughts:  No  Homicidal Thoughts:  No  Memory:  Immediate;   Good  Judgement:  Impaired  Insight:  Lacking  Psychomotor Activity:  Decreased  Concentration:  Concentration: Fair  Recall:  Poor  Fund of Knowledge:  Fair  Language:  Good  Akathisia:  Negative  Handed:  Right  AIMS (if indicated):     Assets:  Resilience  ADL's:  Intact  Cognition:  WNL  Sleep:  Number of Hours: 8 last night without waking     Treatment Plan Summary: Daily contact with patient to assess and evaluate symptoms and progress in treatment and Medication management   Crisis Stabilization Medication management: Continue -Neurontin 100 mg TID for withdrawal symptoms Discharge planning in place Pt will attend group therapy and participate in milieu  Laveda Abbe, NP 03/27/2018, 1:18 PM   ..Agree with NP Progress Note

## 2018-03-27 NOTE — [facility] Group Notes (Signed)
LCSW Group Therapy Note  03/27/2018   10:00-11:00am   Type of Therapy and Topic:  Group Therapy: Anger Cues and Responses  Participation Level:  Did Not Attend   Description of Group:   In this group, patients learned how to recognize the physical, cognitive, emotional, and behavioral responses they have to anger-provoking situations.  They identified a recent time they became angry and how they reacted.  They analyzed how their reaction was possibly beneficial and how it was possibly unhelpful.  The group discussed a variety of healthier coping skills that could help with such a situation in the future.  Deep breathing was practiced briefly.  Therapeutic Goals: 1. Patients will remember their last incident of anger and how they felt emotionally and physically, what their thoughts were at the time, and how they behaved. 2. Patients will identify how their behavior at that time worked for them, as well as how it worked against them. 3. Patients will explore possible new behaviors to use in future anger situations. 4. Patients will learn that anger itself is normal and cannot be eliminated, and that healthier reactions can assist with resolving conflict rather than worsening situations.  Summary of Patient Progress:  N/A  Therapeutic Modalities:   Cognitive Behavioral Therapy  Arnitra Sokoloski J Grossman-Orr    

## 2018-03-28 NOTE — Plan of Care (Signed)
  Problem: Education: Goal: Knowledge of [facility] Education information/materials will improve Outcome: Progressing   

## 2018-03-28 NOTE — Progress Notes (Signed)
Patient did not attend AA group meeting. 

## 2018-03-28 NOTE — [facility] Group Notes (Signed)
[facility] Group Notes: (Clinical Social Work)   03/28/2018      Type of Therapy:  Group Therapy   Participation Level:  Did Not Attend despite MHT prompting   Spencer Peterkin Grossman-Orr, LCSW 03/28/2018, 12:36 PM         

## 2018-03-28 NOTE — [facility] Suicide Risk Assessment (Signed)
[facility] INPATIENT:  Family/Significant Other Suicide Prevention Education  Suicide Prevention Education:  Contact Attempts: Mother, Cassandria Santee, [phone] has been identified by the patient as the family member/significant other with whom the patient will be residing, and identified as the person(s) who will aid the patient in the event of a mental health crisis.  With written consent from the patient, two attempts were made to provide suicide prevention education, prior to and/or following the patient's discharge.  We were unsuccessful in providing suicide prevention education.  A suicide education pamphlet was given to the patient to share with family/significant other.  Date and time of first attempt: 03/28/2018 at 4:05pm Date and time of second attempt: To be completed at a later time  Laura Wagner 03/28/2018, 4:06 PM

## 2018-03-28 NOTE — Progress Notes (Signed)
D Patient is observed OOB UAL on the 300 hall today...she tolerates this moderately fair. SHe endorses a flat, blunted affect. She avoids making eye contact. SHe wears hospital-issue patient scrubs. Her hair is messed up and she needs to bathe. SHe denies needing staff asisstance , in response to writer asking her if she needed staff help to complete her bathing today.      A She willingly completed her daily assessment first thing this morning. On this, she wrote she denied experiencing SI today and she rated her depression, hopelessness and anxeity " 0/0/0/", respectively. She wanders up and down the 300 hall - aimlessly- after Clinical research associate asks her what her goal is for the day. She says " I'm  ready to get thehelloutta here" SHe shakes her head "no" when writer asks her if she wanted to discuss what possible goals she could set tfor herslef.        R Safety is in place,

## 2018-03-28 NOTE — Progress Notes (Addendum)
[facility] MD Progress Note  03/28/2018 12:29 PM Laura Wagner  MRN:  [id number] Subjective:  Laura Wagner is a 23 year old caucasian female who was admitted to [facility] from [facility] after a suicide attempt which she describes as taking a bottle of pills and just turning it up and swallowing them. She stated the pills were benzos she thinks. Pt's UDS positive for amphetamines, cocaine, and benzos, BAL negative. Pt stated she just got out of jail for second degree trespassing and felt like life was not worth living so she walked to a store, took the pills and then was found on the pavement, unresponsive. She stated "I don't remember a lot of what happened, I just remember waking up in the hospital and them telling me what happened." pt does not currently work and is staying with a friend. Pt also stated she has never been in jail before. Pt reported she has a daughter that is 13 months old but she is with a friend of the family.    Today during evaluation: Pt is found in her room, on her bed, and coloring. She did not attend group today. Pt denies suicidal/homicidal ideation, denies auditory/visual hallucinations and does not appear to be responding to internal stimuli. She denies withdrawal symptoms. She appears sad  with  flat affect but stated she feels fine and she is happy. She wrote a letter today (it was placed in her chart) stating she wanted to be discharged today and that she feels she doesn't need to be here anymore. This Clinical research associate asked her how she would keep from returning to substance use and she stated she "would just not think about it." She stated she wants to return to her mother's on discharge. She also stated she does not plan on going around her boyfriend after discharge but then also stated she took the overdose because her boyfriend would not come and pick her up. This is the same boyfriend that ran from the law the day she was picked up and taken to jail for failure to appear charges. Her thought process  and explanations are not grounded in reality, her insight is lacking and her judgement is poor.  She stated she slept a full 8 hours without waking up last night and ate her breakfast this morning.Today she is focused on discharge. She has a court date in [place] 04/19/2018 for second degree trespassing. She has no idea what might happen during the court hearing but then states "I just want to discharge, go to work somewhere, and get my daughter back." She lost custody of her daughter due to her substance abuse issues. Will continue to offer support and encouragement.   Principal Problem: MDD (major depressive disorder), severe (HCC) Diagnosis: Principal Problem:   MDD (major depressive disorder), severe (HCC) Active Problems:   Drug-induced intensive care psychosis (HCC)  Total Time spent with patient: 30 minutes  Past Psychiatric History: As above and see admission H&P  Past Medical History:  Past Medical History:  Diagnosis Date  . Medical history non-contributory     Past Surgical History:  Procedure Laterality Date  . NO PAST SURGERIES     Family History:  Family History  Problem Relation Age of Onset  . Cancer Maternal Grandmother        breast   Family Psychiatric  History: reports some family members have drug problems. Social History:  Social History   Substance and Sexual Activity  Alcohol Use No     Social  History   Substance and Sexual Activity  Drug Use Yes  . Types: Amphetamines, Marijuana    Social History   Socioeconomic History  . Marital status: Single    Spouse name: Not on file  . Number of children: Not on file  . Years of education: Not on file  . Highest education level: Not on file  Occupational History  . Not on file  Social Needs  . Financial resource strain: Not on file  . Food insecurity:    Worry: Not on file    Inability: Not on file  . Transportation needs:    Medical: Not on file    Non-medical: Not on file  Tobacco  Use  . Smoking status: Current Every Day Smoker    Packs/day: 0.25    Years: 2.00    Pack years: 0.50    Types: Cigarettes  . Smokeless tobacco: Never Used  Substance and Sexual Activity  . Alcohol use: No  . Drug use: Yes    Types: Amphetamines, Marijuana  . Sexual activity: Yes    Birth control/protection: None  Lifestyle  . Physical activity:    Days per week: Not on file    Minutes per session: Not on file  . Stress: Not on file  Relationships  . Social connections:    Talks on phone: Not on file    Gets together: Not on file    Attends religious service: Not on file    Active member of club or organization: Not on file    Attends meetings of clubs or organizations: Not on file    Relationship status: Not on file  Other Topics Concern  . Not on file  Social History Narrative  . Not on file   Additional Social History:   Sleep: Good  Appetite:  Good  Current Medications: Current Facility-Administered Medications  Medication Dose Route Frequency Provider Last Rate Last Dose  . acetaminophen (TYLENOL) tablet 650 mg  650 mg Oral Q6H PRN Malvin JohnsFarah, Brian, MD      . alum & mag hydroxide-simeth (MAALOX/MYLANTA) 200-200-20 MG/5ML suspension 30 mL  30 mL Oral Q4H PRN Malvin JohnsFarah, Brian, MD      . gabapentin (NEURONTIN) capsule 100 mg  100 mg Oral TID Malvin JohnsFarah, Brian, MD   100 mg at 03/28/18 0801  . hydrOXYzine (ATARAX/VISTARIL) tablet 25 mg  25 mg Oral TID PRN Malvin JohnsFarah, Brian, MD      . magnesium hydroxide (MILK OF MAGNESIA) suspension 30 mL  30 mL Oral Daily PRN Malvin JohnsFarah, Brian, MD      . nicotine polacrilex (NICORETTE) gum 2 mg  2 mg Oral PRN Cobos, Rockey SituFernando A, MD        Lab Results:  Results for orders placed or performed during the hospital encounter of 03/25/18 (from the past 48 hour(s))  Pregnancy, urine     Status: None   Collection Time: 03/26/18  6:51 PM  Result Value Ref Range   Preg Test, Ur NEGATIVE NEGATIVE    Comment:        THE SENSITIVITY OF THIS METHODOLOGY IS >20  mIU/mL. Performed at [facility], [address] [place], [place] [address]     Blood Alcohol level:  Lab Results  Component Value Date   ETH <10 03/23/2018    Metabolic Disorder Labs: No results found for: HGBA1C, MPG No results found for: PROLACTIN No results found for: CHOL, TRIG, HDL, CHOLHDL, VLDL, LDLCALC  Physical Findings: AIMS: Facial and Oral Movements Muscles  of Facial Expression: None, normal Lips and Perioral Area: None, normal Jaw: None, normal Tongue: None, normal,Extremity Movements Upper (arms, wrists, hands, fingers): None, normal Lower (legs, knees, ankles, toes): None, normal, Trunk Movements Neck, shoulders, hips: None, normal, Overall Severity Severity of abnormal movements (highest score from questions above): None, normal Incapacitation due to abnormal movements: None, normal Patient's awareness of abnormal movements (rate only patient's report): No Awareness, Dental Status Current problems with teeth and/or dentures?: No Does patient usually wear dentures?: No  CIWA:    COWS:     Musculoskeletal: Strength & Muscle Tone: within normal limits Gait & Station: normal Patient leans: N/A  Psychiatric Specialty Exam: Physical Exam  ROS  Blood pressure 105/79, pulse (!) 116, temperature 99.1 F (37.3 C), temperature source Oral, resp. rate 18, height 4\' 11"  (1.499 m), weight 45.8 kg, not currently breastfeeding.Body mass index is 20.4 kg/m.  General Appearance: Casual  Eye Contact:  Good  Speech:  Normal  Volume:  Normal  Mood:  Dysphoric  Affect:  Blunt, improving  Thought Process:  Irrelevant  Orientation:  Full (Time, Place, and Person)  Thought Content:  Illogical  Suicidal Thoughts:  No  Homicidal Thoughts:  No  Memory:  Immediate;   Good  Judgement:  Impaired  Insight:  Lacking  Psychomotor Activity:  Decreased  Concentration:  Concentration: Fair  Recall:  Poor  Fund of Knowledge:  Fair  Language:  Good   Akathisia:  Negative  Handed:  Right  AIMS (if indicated):     Assets:  Resilience  ADL's:  Intact  Cognition:  WNL  Sleep:  Number of Hours: 8 last night without waking     Treatment Plan Summary: Daily contact with patient to assess and evaluate symptoms and progress in treatment and Medication management   Crisis Stabilization Medication management: Continue:  -Neurontin 100 mg TID for withdrawal symptoms -Vistaril 25 mg TID PRN for anxiety Discharge planning in place Will encourage Pt attend group therapy and participate in the therapeutic milieu  Laveda AbbeLaurie Britton Parks, NP 03/28/2018, 12:29 PM    .Agree with NP Progress Note

## 2018-03-29 MED ORDER — GABAPENTIN 100 MG PO CAPS: 100.0000 mg | ORAL_CAPSULE | Freq: Three times a day (TID) | ORAL | 1 refills | Status: DC

## 2018-03-29 MED ORDER — NICOTINE POLACRILEX 2 MG MT GUM: 2.0000 mg | CHEWING_GUM | OROMUCOSAL | 0 refills | Status: DC | PRN

## 2018-03-29 NOTE — Discharge Summary (Signed)
Physician Discharge Summary Note  Patient:  Laura Wagner is an 23 y.o., female  MRN:  [id number]  DOB:  01-15-1996  Patient phone:  [phone] (home)   Patient address:   [address] [place] [address],   Total Time spent with patient: Greater than 30 minutes  Date of Admission:  03/25/2018  Date of Discharge: 03-29-18  Reason for Admission: Suspected drug overdose with administration of Narcan.  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  Past Medical History:  Past Medical History:  Diagnosis Date  . Medical history non-contributory     Past Surgical History:  Procedure Laterality Date  . NO PAST SURGERIES     Family History:  Family History  Problem Relation Age of Onset  . Cancer Maternal Grandmother        breast   Family Psychiatric  History: See H&P. Social History:  Social History   Substance and Sexual Activity  Alcohol Use No     Social History   Substance and Sexual Activity  Drug Use Yes  . Types: Amphetamines, Marijuana    Social History   Socioeconomic History  . Marital status: Single    Spouse name: Not on file  . Number of children: Not on file  . Years of education: Not on file  . Highest education level: Not on file  Occupational History  . Not on file  Social Needs  . Financial resource strain: Not on file  . Food insecurity:    Worry: Not on file    Inability: Not on file  . Transportation needs:    Medical: Not on file    Non-medical: Not on file  Tobacco Use  . Smoking status: Current Every Day Smoker    Packs/day: 0.25    Years: 2.00    Pack years: 0.50    Types: Cigarettes  . Smokeless tobacco: Never Used  Substance and Sexual Activity  . Alcohol use: No  . Drug  use: Yes    Types: Amphetamines, Marijuana  . Sexual activity: Yes    Birth control/protection: None  Lifestyle  . Physical activity:    Days per week: Not on file    Minutes per session: Not on file  . Stress: Not on file  Relationships  . Social connections:    Talks on phone: Not on file    Gets together: Not on file    Attends religious service: Not on file    Active member of club or organization: Not on file    Attends meetings of clubs or organizations: Not on file    Relationship status: Not on file  Other Topics Concern  . Not on file  Social History Narrative  . Not on file   Hospital Course: (Per Md's admission evaluation): Laura Wagner is 23 years of age she has a history of polysubstance abuse and dependencies, she was actually found unconscious at a convenience store and Narcan was administered however her drug screen showed amphetamines, benzodiazepines and cocaine by the time she came to our attention, when she was alert, she became combative.  She gives me a variable history-story basically changing as I interview her. At first she told me she was walking to work got tired and was in her car sleeping when the police arrived, later she tells  me she does not work and that she was simply walking from [place] to "see her old man". At any rate she continues to deny that she is a substance abuser despite being confronted with a positive drug screen at her history.  She also acknowledges losing custody of her children due to drug use but then paradoxically denies a history of substance abuse.  After the above admission evaluation, Laura Wagner was recommended for inpatient hospitalization for treatment. However, she maintained that she was never a drug addict or have had issues with substance abuse issues. Her UDS on admission was positive for multiple substances. She has her children taken away from because of her drug addiction. And yet, Laura Wagner continue to exhibit poor insight about her  drug use issues. She was recommended for treatment.  Laura Wagner received no detoxification treatment. Although with her UDS reports of positve Amphetamine, Benzodiazepine & Cocaine, she was not presenting with any substance withdrawal symptoms on arrival to the hospital. She was however, encouraged to confront the fact that she does have substance abuse issues and should deal with the problem appropriately by engaging in some of of substance abuse treatment program. She was medicated & discharged on Gabapentin 100 mg for agitation/substance withdrawal syndrome. She presented no other significant medical issues that required treatment. She tolerated her treatment regimen without any adverse effects or reactions reported.  Besides the management of agitation/substance withdrawal syndrome using Gabapentin, Laura Wagner was also enrolled & participated in the scheduled group counseling sessions & activities being offered & held on this unit. She learned coping skills. During the course of her hospitalization, she presented on daily basis an improved mood & decreased anxiety levels. She is being discharged today to her home with family to follow-up care on outpatient basis as noted below. She was provided with all the necessary information needed to make this appointment without problems.  Upon discharge, Laura Wagner adamantly denies any SIHI, AVH, delusional thoughts or paranoia. She was able to engage in safety planning including plan to return to [facility] or contact emergency services if she feels unable to maintain her own safety or the safety of others. Pt had no further questions, comments or concerns. She left [facility] with all personal belongings no apparent distress.  Physical Findings: AIMS: Facial and Oral Movements Muscles of Facial Expression: None, normal Lips and Perioral Area: None, normal Jaw: None, normal Tongue: None, normal,Extremity Movements Upper (arms, wrists, hands, fingers): None, normal Lower (legs,  knees, ankles, toes): None, normal, Trunk Movements Neck, shoulders, hips: None, normal, Overall Severity Severity of abnormal movements (highest score from questions above): None, normal Incapacitation due to abnormal movements: None, normal Patient's awareness of abnormal movements (rate only patient's report): No Awareness, Dental Status Current problems with teeth and/or dentures?: No Does patient usually wear dentures?: No  CIWA:    COWS:     Musculoskeletal: Strength & Muscle Tone: within normal limits Gait & Station: normal Patient leans: N/A  Psychiatric Specialty Exam: Physical Exam  Nursing note and vitals reviewed. Constitutional: She is oriented to person, place, and time. She appears well-developed.  HENT:  Head: Normocephalic.  Eyes: Pupils are equal, round, and reactive to light.  Neck: Normal range of motion.  Cardiovascular: Normal rate.  Respiratory: Effort normal.  GI: Soft.  Genitourinary:    Genitourinary Comments: Deferred   Musculoskeletal: Normal range of motion.  Neurological: She is alert and oriented to person, place, and time.  Skin: Skin is warm.    Review of Systems  Constitutional:  Negative.   HENT: Negative.   Eyes: Negative.   Respiratory: Negative.  Negative for cough and hemoptysis.   Cardiovascular: Negative.  Negative for chest pain and palpitations.  Gastrointestinal: Negative.  Negative for abdominal pain, heartburn, nausea and vomiting.  Genitourinary: Negative.   Musculoskeletal: Negative.   Skin: Negative.   Neurological: Negative.  Negative for dizziness and headaches.  Endo/Heme/Allergies: Negative.   Psychiatric/Behavioral: Positive for depression (Stable) and substance abuse (Hx. Amphetamine, Benzodiazepine & THC use disorders). Negative for hallucinations, memory loss and suicidal ideas. The patient has insomnia (Stable). The patient is not nervous/anxious (Stable).     Blood pressure (!) 83/62, pulse (!) 103, temperature  99.1 F (37.3 C), temperature source Oral, resp. rate 16, height 4\' 11"  (1.499 m), weight 45.8 kg, not currently breastfeeding.Body mass index is 20.4 kg/m.  See Md's discharge SRA   Have you used any form of tobacco in the last 30 days? (Cigarettes, Smokeless Tobacco, Cigars, and/or Pipes): Yes  Has this patient used any form of tobacco in the last 30 days? (Cigarettes, Smokeless Tobacco, Cigars, and/or Pipes): Yes, an FDA-approved tobacco cessation medication was offered at discharge.  Blood Alcohol level:  Lab Results  Component Value Date   ETH <10 03/23/2018   Metabolic Disorder Labs:  No results found for: HGBA1C, MPG No results found for: PROLACTIN No results found for: CHOL, TRIG, HDL, CHOLHDL, VLDL, LDLCALC  See Psychiatric Specialty Exam and Suicide Risk Assessment completed by Attending Physician prior to discharge.  Discharge destination:  Home  Is patient on multiple antipsychotic therapies at discharge:  No   Has Patient had three or more failed trials of antipsychotic monotherapy by history:  No  Recommended Plan for Multiple Antipsychotic Therapies: NA   Allergies as of 03/29/2018   No Known Allergies     Medication List    TAKE these medications     Indication  gabapentin 100 MG capsule Commonly known as:  NEURONTIN Take 1 capsule (100 mg total) by mouth 3 (three) times daily. For agitation  Indication:  Agitation   nicotine polacrilex 2 MG gum Commonly known as:  NICORETTE Take 1 each (2 mg total) by mouth as needed for smoking cessation. (May purchase from over the counter): For smoking cessation  Indication:  Nicotine Addiction      Follow-up Information    Services, [facility] Follow up.   Why:  Your hospital follow up appointment is  Contact information: [address] [place] [id number] [phone]          Follow-up recommendations: Activity:  As tolerated Diet: As recommended by your primary care doctor. Keep all scheduled  follow-up appointments as recommended.  Comments: Patient is instructed prior to discharge to: Take all medications as prescribed by his/her mental healthcare provider. Report any adverse effects and or reactions from the medicines to his/her outpatient provider promptly. Patient has been instructed & cautioned: To not engage in alcohol and or illegal drug use while on prescription medicines. In the event of worsening symptoms, patient is instructed to call the crisis hotline, 911 and or go to the nearest ED for appropriate evaluation and treatment of symptoms. To follow-up with his/her primary care provider for your other medical issues, concerns and or health care needs.   Signed: Armandina StammerAgnes , NP, PMHNP, FNP-BC 03/29/2018, 9:53 AM

## 2018-03-29 NOTE — Progress Notes (Signed)
Discharge note: Patient reviewed discharge paperwork with RN including prescriptions, follow up appointments, and lab work. Patient given the opportunity to ask questions. All concerns were addressed. All belongings were returned to patient. Denied SI/HI/AVH. Patient thanked staff for their care while at the hospital.  Patient was discharged to lobby with bus pass- said she's going to her mom's house.

## 2018-03-29 NOTE — Progress Notes (Signed)
D: Pt was in dayroom upon initial approach.  Pt presents with depressed affect and mood.  She reports her day has been "good."  Her goal is to "stay positive" and she reports she is doing "good so far."  Pt denies SI/HI, denies hallucinations, denies pain.  Pt has been visible in milieu interacting with peers and staff appropriately.  .    A: Introduced self to pt.  Met with pt 1:1.  Actively listened to pt and offered support and encouragement.  Q15 minute safety checks maintained.  R: Pt is safe on the unit.  Pt verbally contracts for safety.  Will continue to monitor and assess.

## 2018-03-29 NOTE — Plan of Care (Addendum)
Patient self inventory- Patient slept well last night, sleep medication was not requested. Appetite is good, energy level normal, concentration good. Depression, hopelessness, and anxiety all rated 0/10. Denies withdrawal symptoms. Patient's goal is to "stay focused and stay positive."  Patient is compliant with medications prescribed by provider. No side effects noted.  Safety is maintained with 15 minute checks as well as environmental checks. Will continue to monitor and provide support.   Problem: Education: Goal: Knowledge of [facility] Education information/materials will improve Outcome: Adequate for Discharge   Problem: Education: Goal: Emotional status will improve Outcome: Adequate for Discharge   Problem: Education: Goal: Mental status will improve Outcome: Adequate for Discharge   Problem: Education: Goal: Verbalization of understanding the information provided will improve Outcome: Adequate for Discharge   Problem: Activity: Goal: Interest or engagement in activities will improve Outcome: Adequate for Discharge

## 2018-03-29 NOTE — [facility] Suicide Risk Assessment (Signed)
[facility] INPATIENT:  Family/Significant Other Suicide Prevention Education  Suicide Prevention Education:  Education Completed; Cassandria Santee [phone])  has been identified by the patient as the family member/significant other with whom the patient will be residing, and identified as the person(s) who will aid the patient in the event of a mental health crisis (suicidal ideations/suicide attempt).  With written consent from the patient, the family member/significant other has been provided the following suicide prevention education, prior to the and/or following the discharge of the patient.  The suicide prevention education provided includes the following:  Suicide risk factors  Suicide prevention and interventions  National Suicide Hotline telephone number  [facility] assessment telephone number  [facility] Emergency Assistance 911  [facility] and/or Residential Mobile Crisis Unit telephone number  Request made of family/significant other to:  Remove weapons (e.g., guns, rifles, knives), all items previously/currently identified as safety concern.    Remove drugs/medications (over-the-counter, prescriptions, illicit drugs), all items previously/currently identified as a safety concern.  The family member/significant other verbalizes understanding of the suicide prevention education information provided.  The family member/significant other agrees to remove the items of safety concern listed above.  Mom doesn't believe patient is ready to discharge. Mom says patient cannot live with her, as mom is currently staying in a hotel and has no transportation.  CSW will follow up with patient to determine an appropriate discharge plan.  Darreld Mclean 03/29/2018, 9:26 AM

## 2018-03-29 NOTE — Progress Notes (Signed)
  [facility] Adult Case Management Discharge Plan :  Will you be returning to the same living situation after discharge: Yes, staying with friends in [place]. At discharge, do you have transportation home?: Yes,  friend will pick up this afternoon Do you have the ability to pay for your medications: Yes,  Medicaid  Release of information consent forms completed and in the chart.  Patient to Follow up at: Follow-up Information    [facility] Follow up.   Contact information: [address] [place] [address] [phone]           Next level of care provider has access to [facility] Link:no  Safety Planning and Suicide Prevention discussed: Yes,  attempted twice with mother; will complete with patient.  Have you used any form of tobacco in the last 30 days? (Cigarettes, Smokeless Tobacco, Cigars, and/or Pipes): Yes  Has patient been referred to the Quitline?: Patient refused referral  Patient has been referred for addiction treatment: Yes  Darreld Mclean, LCSWA 03/29/2018, 9:22 AM

## 2018-03-29 NOTE — Progress Notes (Signed)
Recreation Therapy Notes  Date: 1.13.20 Time: 0930 Location: [address] Dayroom  Group Topic: Stress Management  Goal Area(s) Addresses:  Patient will identify stress management techniques. Patient will identify benefits of using stress management techniques post d/c.  Behavioral Response: Engaged  Intervention: Stress Management  Activity :  Meditation.  LRT introduced the stress management technique of meditation.  LRT played Wagner meditation on being resilient in the face of adversity.  Patients were to follow along as meditation played to engage in activity.  Education:  Stress Management, Discharge Planning.   Education Outcome: Acknowledges Education  Clinical Observations/Feedback: Pt attended and participated in activity.   Laura Wagner, LRT/CTRS         Laura Wagner 03/29/2018 11:48 AM 

## 2018-03-29 NOTE — [facility] Suicide Risk Assessment (Signed)
[facility] Discharge Suicide Risk Assessment   Principal Problem: MDD (major depressive disorder), severe (HCC) Discharge Diagnoses: Principal Problem:   MDD (major depressive disorder), severe (HCC) Active Problems:   Drug-induced intensive care psychosis (HCC)   Total Time spent with patient: 30 minutes Current mental status exam alert oriented denies wanting to harm self or others affect appropriate for the situation no psychosis, however biggest risk is an unintentional overdose with this patient not an intentional one  Mental Status Per Nursing Assessment::   On Admission:  NA  Demographic Factors:  Caucasian  Loss Factors: Decrease in vocational status  Historical Factors: Impulsivity  Risk Reduction Factors:   Religious beliefs about death  Continued Clinical Symptoms:  Alcohol/Substance Abuse/Dependencies More than one psychiatric diagnosis  Cognitive Features That Contribute To Risk:  Loss of executive function    Suicide Risk:  Mild:  Suicidal ideation of limited frequency, intensity, duration, and specificity.  There are no identifiable plans, no associated intent, mild dysphoria and related symptoms, good self-control (both objective and subjective assessment), few other risk factors, and identifiable protective factors, including available and accessible social support. Again biggest risk is an unintentional overdose Follow-up Information    [facility] Follow up.   Why:  Your hospital follow up appointment is  Contact information: [address] [place] [address] [phone]           Plan Of Care/Follow-up recommendations:  Activity:  full  Jordy Verba, MD 03/29/2018, 9:44 AM

## 2021-05-04 ENCOUNTER — Telehealth: Payer: Self-pay | Admitting: Nurse Practitioner

## 2021-05-04 NOTE — Progress Notes (Signed)
   Complete physical exam  Patient: Laura Wagner   DOB: 01/04/2001   26 y.o. Female  MRN: [id number]  Subjective:    No chief complaint on file.   Laura Wagner is a 26 y.o. female who presents today for a complete physical exam. She reports consuming a {diet types:17450} diet. {types:19826} She generally feels {DESC; WELL/FAIRLY WELL/POORLY:18703}. She reports sleeping {DESC; WELL/FAIRLY WELL/POORLY:18703}. She {does/does not:200015} have additional problems to discuss today.    Most recent fall risk assessment:    09/11/2021   10:42 AM  Fall Risk   Falls in the past year? 0  Number falls in past yr: 0  Injury with Fall? 0  Risk for fall due to : No Fall Risks  Follow up Falls evaluation completed     Most recent depression screenings:    09/11/2021   10:42 AM 08/02/2020   10:46 AM  PHQ 2/9 Scores  PHQ - 2 Score 0 0  PHQ- 9 Score 5     {VISON DENTAL STD PSA (Optional):27386}  {History (Optional):23778}  Patient Care Team: Jessup, Joy, NP as PCP - General (Nurse Practitioner)   Outpatient Medications Prior to Visit  Medication Sig   fluticasone (FLONASE) 50 MCG/ACT nasal spray Place 2 sprays into both nostrils in the morning and at bedtime. After 7 days, reduce to once daily.   norgestimate-ethinyl estradiol (SPRINTEC 28) 0.25-35 MG-MCG tablet Take 1 tablet by mouth daily.   Nystatin POWD Apply liberally to affected area 2 times per day   spironolactone (ALDACTONE) 100 MG tablet Take 1 tablet (100 mg total) by mouth daily.   No facility-administered medications prior to visit.    ROS        Objective:     There were no vitals taken for this visit. {Vitals History (Optional):23777}  Physical Exam   No results found for any visits on 10/17/21. {Show previous labs (optional):23779}    Assessment & Plan:    Routine Health Maintenance and Physical Exam  Immunization History  Administered Date(s) Administered   DTaP 03/20/1999, 05/16/1999,  07/25/1999, 04/09/2000, 10/24/2003   Hepatitis A 08/20/2007, 08/25/2008   Hepatitis B 01/05/1999, 02/12/1999, 07/25/1999   HiB (PRP-OMP) 03/20/1999, 05/16/1999, 07/25/1999, 04/09/2000   IPV 03/20/1999, 05/16/1999, 01/13/2000, 10/24/2003   Influenza,inj,Quad PF,6+ Mos 11/25/2013   Influenza-Unspecified 02/25/2012   MMR 01/12/2001, 10/24/2003   Meningococcal Polysaccharide 08/25/2011   Pneumococcal Conjugate-13 04/09/2000   Pneumococcal-Unspecified 07/25/1999, 10/08/1999   Tdap 08/25/2011   Varicella 01/13/2000, 08/20/2007    Health Maintenance  Topic Date Due   HIV Screening  Never done   Hepatitis C Screening  Never done   INFLUENZA VACCINE  10/15/2021   PAP-Cervical Cytology Screening  10/17/2021 (Originally 01/04/2020)   PAP SMEAR-Modifier  10/17/2021 (Originally 01/04/2020)   TETANUS/TDAP  10/17/2021 (Originally 08/24/2021)   HPV VACCINES  Discontinued   COVID-19 Vaccine  Discontinued    Discussed health benefits of physical activity, and encouraged her to engage in regular exercise appropriate for her age and condition.  Problem List Items Addressed This Visit   None Visit Diagnoses     Annual physical exam    -  Primary   Cervical cancer screening       Need for Tdap vaccination          No follow-ups on file.     Joy Jessup, NP   

## 2021-05-13 DIAGNOSIS — O0990 Supervision of high risk pregnancy, unspecified, unspecified trimester: Secondary | ICD-10-CM | POA: Insufficient documentation

## 2021-05-13 DIAGNOSIS — Z3490 Encounter for supervision of normal pregnancy, unspecified, unspecified trimester: Secondary | ICD-10-CM | POA: Insufficient documentation

## 2021-05-15 ENCOUNTER — Encounter: Payer: Medicaid Other | Admitting: Women's Health

## 2021-05-15 ENCOUNTER — Ambulatory Visit: Payer: Medicaid Other | Admitting: *Deleted

## 2021-05-15 ENCOUNTER — Other Ambulatory Visit: Payer: Self-pay | Admitting: Obstetrics & Gynecology

## 2021-05-15 DIAGNOSIS — Z3480 Encounter for supervision of other normal pregnancy, unspecified trimester: Secondary | ICD-10-CM

## 2021-05-15 DIAGNOSIS — Z3682 Encounter for antenatal screening for nuchal translucency: Secondary | ICD-10-CM

## 2021-05-16 ENCOUNTER — Encounter: Payer: Self-pay | Admitting: Women's Health

## 2021-05-16 ENCOUNTER — Other Ambulatory Visit: Payer: Self-pay | Admitting: Obstetrics & Gynecology

## 2021-05-16 ENCOUNTER — Ambulatory Visit (INDEPENDENT_AMBULATORY_CARE_PROVIDER_SITE_OTHER): Payer: Medicaid Other | Admitting: Women's Health

## 2021-05-16 ENCOUNTER — Other Ambulatory Visit: Payer: Self-pay

## 2021-05-16 ENCOUNTER — Ambulatory Visit: Payer: Medicaid Other | Admitting: *Deleted

## 2021-05-16 ENCOUNTER — Ambulatory Visit (INDEPENDENT_AMBULATORY_CARE_PROVIDER_SITE_OTHER): Payer: Medicaid Other

## 2021-05-16 VITALS — BP 149/97 | HR 93 | Wt 125.0 lb

## 2021-05-16 DIAGNOSIS — F17200 Nicotine dependence, unspecified, uncomplicated: Secondary | ICD-10-CM

## 2021-05-16 DIAGNOSIS — Z3482 Encounter for supervision of other normal pregnancy, second trimester: Secondary | ICD-10-CM

## 2021-05-16 DIAGNOSIS — Z3480 Encounter for supervision of other normal pregnancy, unspecified trimester: Secondary | ICD-10-CM | POA: Diagnosis not present

## 2021-05-16 DIAGNOSIS — Z3A27 27 weeks gestation of pregnancy: Secondary | ICD-10-CM

## 2021-05-16 DIAGNOSIS — R8271 Bacteriuria: Secondary | ICD-10-CM

## 2021-05-16 DIAGNOSIS — O09299 Supervision of pregnancy with other poor reproductive or obstetric history, unspecified trimester: Secondary | ICD-10-CM

## 2021-05-16 DIAGNOSIS — O0932 Supervision of pregnancy with insufficient antenatal care, second trimester: Secondary | ICD-10-CM | POA: Diagnosis not present

## 2021-05-16 DIAGNOSIS — Z131 Encounter for screening for diabetes mellitus: Secondary | ICD-10-CM

## 2021-05-16 DIAGNOSIS — Z363 Encounter for antenatal screening for malformations: Secondary | ICD-10-CM

## 2021-05-16 DIAGNOSIS — Z3682 Encounter for antenatal screening for nuchal translucency: Secondary | ICD-10-CM

## 2021-05-16 DIAGNOSIS — F1910 Other psychoactive substance abuse, uncomplicated: Secondary | ICD-10-CM

## 2021-05-16 LAB — POCT URINALYSIS DIPSTICK OB
Glucose, UA: NEGATIVE
Ketones, UA: NEGATIVE
Leukocytes, UA: NEGATIVE
Nitrite, UA: NEGATIVE

## 2021-05-16 MED ORDER — ASPIRIN 81 MG PO TBEC: 162.0000 mg | DELAYED_RELEASE_TABLET | Freq: Every day | ORAL | 2 refills | Status: DC

## 2021-05-16 MED ORDER — BLOOD PRESSURE MONITOR MISC: 0 refills | Status: DC

## 2021-05-16 NOTE — Patient Instructions (Addendum)
Laura Wagner, thank you for choosing our office today! We appreciate the opportunity to meet your healthcare needs. You may receive a short survey by mail, e-mail, or through MyChart. If you are happy with your care we would appreciate if you could take just a few minutes to complete the survey questions. We read all of your comments and take your feedback very seriously. Thank you again for choosing our office.  [facility] ([address] [place], [place] [address]) Entrance C, located off of [address] Free 24/7 valet parking   CLASSES: Go to [email or web address] to register for classes (childbirth, breastfeeding, waterbirth, infant CPR, daddy bootcamp, etc.)  Call the office ([phone]) or go to [facility] if: You begin to have strong, frequent contractions Your water breaks.  Sometimes it is a big gush of fluid, sometimes it is just a trickle that keeps getting your panties wet or running down your legs You have vaginal bleeding.  It is normal to have a small amount of spotting if your cervix was checked.  You don't feel your baby moving like normal.  If you don't, get you something to eat and drink and lay down and focus on feeling your baby move.   If your baby is Kiper not moving like normal, you should call the office or go to [facility].  Call the office ([phone]) or go to [facility] for these signs of pre-eclampsia: Severe headache that does not go away with Tylenol Visual changes- seeing spots, double, blurred vision Pain under your right breast or upper abdomen that does not go away with Tums or heartburn medicine Nausea and/or vomiting Severe swelling in your hands, feet, and face   Tdap Vaccine It is recommended that you get the Tdap vaccine during the third trimester of EACH pregnancy to help protect your baby from getting pertussis (whooping cough) 27-36 weeks is the BEST time to do  this so that you can pass the protection on to your baby. During pregnancy is better than after pregnancy, but if you are unable to get it during pregnancy it will be offered at the hospital.  You can get this vaccine with us, at the health department, your family doctor, or some local pharmacies Everyone who will be around your baby should also be up-to-date on their vaccines before the baby comes. Adults (who are not pregnant) only need 1 dose of Tdap during adulthood.   [place] Pediatricians/Family Doctors [facility] ([facility]): [address], [phone]           [facility]: [address], [phone]                [facility] ([facility]): [address], [phone] (call to ask if accepting patients) [facility]: [address], [place], [phone]    [place] Pediatricians/Family Doctors [facility] ([facility]): [address], [phone] [facility]: [address], [phone] [facility]: [address], [phone]  [place] [facility]  [facility] ([facility]): [phone] [facility]: [address], [phone]   [place] [facility]: [address], [phone]  [place] [facility]  [facility]: [address], [phone]  Home Blood Pressure Monitoring for Patients   Your provider has recommended that you check your   blood pressure (BP) at least once a week at home. If you do not have a blood pressure cuff at home, one will be provided for you. Contact your provider if you have not received your monitor within 1 week.   Helpful Tips for Accurate Home Blood Pressure Checks  Don't smoke, exercise, or drink caffeine 30 minutes before checking your BP Use the restroom before checking your BP (a full bladder can raise your  pressure) Relax in a comfortable upright chair Feet on the ground Left arm resting comfortably on a flat surface at the level of your heart Legs uncrossed Back supported Sit quietly and don't talk Place the cuff on your bare arm Adjust snuggly, so that only two fingertips can fit between your skin and the top of the cuff Check 2 readings separated by at least one minute Keep a log of your BP readings For a visual, please reference this diagram: http://ccnc.care/bpdiagram  Provider Name: [facility]     Phone: [phone]  Zone 1: ALL CLEAR  Continue to monitor your symptoms:  BP reading is less than 140 (top number) or less than 90 (bottom number)  No right upper stomach pain No headaches or seeing spots No feeling nauseated or throwing up No swelling in face and hands  Zone 2: CAUTION Call your doctor's office for any of the following:  BP reading is greater than 140 (top number) or greater than 90 (bottom number)  Stomach pain under your ribs in the middle or right side Headaches or seeing spots Feeling nauseated or throwing up Swelling in face and hands  Zone 3: EMERGENCY  Seek immediate medical care if you have any of the following:  BP reading is greater than160 (top number) or greater than 110 (bottom number) Severe headaches not improving with Tylenol Serious difficulty catching your breath Any worsening symptoms from Zone 2   Third Trimester of Pregnancy The third trimester is from week 29 through week 42, months 7 through 9. The third trimester is a time when the fetus is growing rapidly. At the end of the ninth month, the fetus is about 20 inches in length and weighs 6-10 pounds.  BODY CHANGES Your body goes through many changes during pregnancy. The changes vary from woman to woman.  Your weight will continue to increase. You can expect to gain 25-35 pounds (11-16 kg) by the end of the pregnancy. You may begin to get stretch marks on your hips, abdomen,  and breasts. You may urinate more often because the fetus is moving lower into your pelvis and pressing on your bladder. You may develop or continue to have heartburn as a result of your pregnancy. You may develop constipation because certain hormones are causing the muscles that push waste through your intestines to slow down. You may develop hemorrhoids or swollen, bulging veins (varicose veins). You may have pelvic pain because of the weight gain and pregnancy hormones relaxing your joints between the bones in your pelvis. Backaches may result from overexertion of the muscles supporting your posture. You may have changes in your hair. These can include thickening of your hair, rapid growth, and changes in texture. Some women also have hair loss during or after pregnancy, or hair that feels dry or thin. Your hair will most likely return to normal after your baby is born. Your breasts will continue to grow and be tender. A yellow discharge may leak from your breasts called colostrum. Your belly button may stick out. You may   feel short of breath because of your expanding uterus. You may notice the fetus "dropping," or moving lower in your abdomen. You may have a bloody mucus discharge. This usually occurs a few days to a week before labor begins. Your cervix becomes thin and soft (effaced) near your due date. WHAT TO EXPECT AT YOUR PRENATAL EXAMS  You will have prenatal exams every 2 weeks until week 36. Then, you will have weekly prenatal exams. During a routine prenatal visit: You will be weighed to make sure you and the fetus are growing normally. Your blood pressure is taken. Your abdomen will be measured to track your baby's growth. The fetal heartbeat will be listened to. Any test results from the previous visit will be discussed. You may have a cervical check near your due date to see if you have effaced. At around 36 weeks, your caregiver will check your cervix. At the same time, your  caregiver will also perform a test on the secretions of the vaginal tissue. This test is to determine if a type of bacteria, Group B streptococcus, is present. Your caregiver will explain this further. Your caregiver may ask you: What your birth plan is. How you are feeling. If you are feeling the baby move. If you have had any abnormal symptoms, such as leaking fluid, bleeding, severe headaches, or abdominal cramping. If you have any questions. Other tests or screenings that may be performed during your third trimester include: Blood tests that check for low iron levels (anemia). Fetal testing to check the health, activity level, and growth of the fetus. Testing is done if you have certain medical conditions or if there are problems during the pregnancy. FALSE LABOR You may feel small, irregular contractions that eventually go away. These are called Braxton Hicks contractions, or false labor. Contractions may last for hours, days, or even weeks before true labor sets in. If contractions come at regular intervals, intensify, or become painful, it is best to be seen by your caregiver.  SIGNS OF LABOR  Menstrual-like cramps. Contractions that are 5 minutes apart or less. Contractions that start on the top of the uterus and spread down to the lower abdomen and back. A sense of increased pelvic pressure or back pain. A watery or bloody mucus discharge that comes from the vagina. If you have any of these signs before the 37th week of pregnancy, call your caregiver right away. You need to go to the hospital to get checked immediately. HOME CARE INSTRUCTIONS  Avoid all smoking, herbs, alcohol, and unprescribed drugs. These chemicals affect the formation and growth of the baby. Follow your caregiver's instructions regarding medicine use. There are medicines that are either safe or unsafe to take during pregnancy. Exercise only as directed by your caregiver. Experiencing uterine cramps is a good sign to  stop exercising. Continue to eat regular, healthy meals. Wear a good support bra for breast tenderness. Do not use hot tubs, steam rooms, or saunas. Wear your seat belt at all times when driving. Avoid raw meat, uncooked cheese, cat litter boxes, and soil used by cats. These carry germs that can cause birth defects in the baby. Take your prenatal vitamins. Try taking a stool softener (if your caregiver approves) if you develop constipation. Eat more high-fiber foods, such as fresh vegetables or fruit and whole grains. Drink plenty of fluids to keep your urine clear or pale yellow. Take warm sitz baths to soothe any pain or discomfort caused by hemorrhoids. Use hemorrhoid cream if  your caregiver approves. If you develop varicose veins, wear support hose. Elevate your feet for 15 minutes, 3-4 times a day. Limit salt in your diet. Avoid heavy lifting, wear low heal shoes, and practice good posture. Rest a lot with your legs elevated if you have leg cramps or low back pain. Visit your dentist if you have not gone during your pregnancy. Use a soft toothbrush to brush your teeth and be gentle when you floss. A sexual relationship may be continued unless your caregiver directs you otherwise. Do not travel far distances unless it is absolutely necessary and only with the approval of your caregiver. Take prenatal classes to understand, practice, and ask questions about the labor and delivery. Make a trial run to the hospital. Pack your hospital bag. Prepare the baby's nursery. Continue to go to all your prenatal visits as directed by your caregiver. SEEK MEDICAL CARE IF: You are unsure if you are in labor or if your water has broken. You have dizziness. You have mild pelvic cramps, pelvic pressure, or nagging pain in your abdominal area. You have persistent nausea, vomiting, or diarrhea. You have a bad smelling vaginal discharge. You have pain with urination. SEEK IMMEDIATE MEDICAL CARE IF:  You  have a fever. You are leaking fluid from your vagina. You have spotting or bleeding from your vagina. You have severe abdominal cramping or pain. You have rapid weight loss or gain. You have shortness of breath with chest pain. You notice sudden or extreme swelling of your face, hands, ankles, feet, or legs. You have not felt your baby move in over an hour. You have severe headaches that do not go away with medicine. You have vision changes. Document Released: 02/25/2001 Document Revised: 03/08/2013 Document Reviewed: 05/04/2012 [facility] Patient Information 2015 [place], [place]. This information is not intended to replace advice given to you by your health care provider. Make sure you discuss any questions you have with your health care provider.

## 2021-05-16 NOTE — Progress Notes (Signed)
? ? ?INITIAL OBSTETRICAL VISIT ?Patient name: Laura Wagner MRN [id number]  Date of birth: 10-Aug-1995 ?Chief Complaint:   ?Initial Prenatal Visit (Has restless leg ) ? ?History of Present Illness:   ?Laura Wagner is a 26 y.o. G80P1011 Caucasian female at [redacted]w[redacted]d by  today's u/s,  with an Estimated Date of Delivery: 08/10/21 being seen today for her initial obstetrical visit.   ?No LMP recorded. Patient is pregnant. ?Her obstetrical history is significant for  SAB x 1, then term IOL for severe pre-e and developed PPHTN .  No problems w/ bp since that she is aware of. Denies ha, visual changes, ruq/epigastric pain, n/v.   ?Today she reports  didn't know she was pregnant until about a month ago . Using THC and amphetamines prior to pregnancy, stopped amphetamines about a month ago when she had +PT.   ?Last pap 2018. Results were: NILM w/ HRHPV not done ? ?Depression screen [place] [place] 2/9 05/16/2021 07/30/2016 07/09/2016 07/09/2016  ?Decreased Interest 0 1 2 2   ?Down, Depressed, Hopeless 1 0 1 1  ?PHQ - 2 Score 1 1 3 3   ?Altered sleeping 0 1 3 -  ?Tired, decreased energy 1 1 3  -  ?Change in appetite 0 0 0 -  ?Feeling bad or failure about yourself  0 0 0 -  ?Trouble concentrating 0 0 0 -  ?Moving slowly or fidgety/restless 0 0 1 -  ?Suicidal thoughts 0 0 0 -  ?PHQ-9 Score 2 3 10  -  ? ?  ?GAD 7 : Generalized Anxiety Score 05/16/2021  ?Nervous, Anxious, on Edge 1  ?Control/stop worrying 0  ?Worry too much - different things 0  ?Trouble relaxing 1  ?Restless 2  ?Easily annoyed or irritable 2  ?Afraid - awful might happen 0  ?Total GAD 7 Score 6  ? ? ? ?Review of Systems:   ?Pertinent items are noted in HPI ?Denies cramping/contractions, leakage of fluid, vaginal bleeding, abnormal vaginal discharge w/ itching/odor/irritation, headaches, visual changes, shortness of breath, chest pain, abdominal pain, severe nausea/vomiting, or problems with urination or bowel movements unless otherwise stated above.  ?Pertinent History Reviewed:   ?Reviewed past medical,surgical, social, obstetrical and family history.  ?Reviewed problem list, medications and allergies. ?OB History  ?Gravida Para Term Preterm AB Living  ?3 1 1   1 1   ?SAB IAB Ectopic Multiple Live Births  ?1     0 1  ?  ?# Outcome Date GA Lbr Len/2nd Weight Sex Delivery Anes PTL Lv  ?3 Current           ?2 Term 12/25/16 [redacted]w[redacted]d 11:53 / 00:22 6 lb 11.9 oz (3.059 kg) F Vag-Spont EPI, Local  LIV  ?1 SAB 2017          ? ?Physical Assessment:  ? ?Vitals:  ? 05/16/21 1055  ?BP: (!) 149/97  ?Pulse: 93  ?Weight: 125 lb (56.7 kg)  ?Body mass index is 25.25 kg/m?. ? ?     Physical Examination: ? General appearance - well appearing, and in no distress ? Mental status - alert, oriented to person, place, and time ? Psych:  She has a normal mood and affect ? Skin - warm and dry, normal color, no suspicious lesions noted ? Chest - effort normal, all lung fields clear to auscultation bilaterally ? Heart - normal rate and regular rhythm ? Abdomen - soft, nontender ? Extremities:  No swelling or varicosities noted ? Thin prep pap declined today, wants next visit ? ?Chaperone:  N/A   ? ?TODAY'S ANATOMY U/S [place] [id number] wks,cephalic,anterior placenta gr 2,normal ovaries,FHR 159 BPM,EFW 1205 g,CX 2.9 cm,AFI 18 cm,anatomy complete,no obvious abnormalities  ? ?Results for orders placed or performed in visit on 05/16/21 (from the past 24 hour(s))  ?POC Urinalysis Dipstick OB  ? Collection Time: 05/16/21 10:48 AM  ?Result Value Ref Range  ? Color, UA    ? Clarity, UA    ? Glucose, UA Negative Negative  ? Bilirubin, UA    ? Ketones, UA neg   ? Spec Grav, UA    ? Blood, UA trace   ? pH, UA    ? POC,PROTEIN,UA Moderate (2+) Negative, Trace, Small (1+), Moderate (2+), Large (3+), 4+  ? Urobilinogen, UA    ? Nitrite, UA neg   ? Leukocytes, UA Negative Negative  ? Appearance    ? Odor    ?  ?Assessment & Plan:  ?1) Low-Risk Pregnancy G3P1011 at [redacted]w[redacted]d with an Estimated Date of Delivery: 08/10/21  ? ?2) Initial OB visit ? ?3) H/O  severe pre-e and PPHTN> bp elevated today, start ASA 162mg  eventhough may not have much benefit. Get pre-e labs today. Check bp's at home, let us know if >140/90. Reviewed pre-e s/s, reasons to seek care. F/U next week for bp check nurse/pn2 ? ?Meds:  ?Meds ordered this encounter  ?Medications  ? Blood Pressure Monitor MISC  ?  Sig: For regular home bp monitoring during pregnancy  ?  Dispense:  1 each  ?  Refill:  0  ?  Z34.81 ?Please mail to patient  ? aspirin 81 MG EC tablet  ?  Sig: Take 2 tablets (162 mg total) by mouth daily. Swallow whole.  ?  Dispense:  180 tablet  ?  Refill:  2  ?  Order Specific Question:   Supervising Provider  ?  Answer:   Tania Ade H [[id number]]  ? ? ?Initial labs obtained ?Continue prenatal vitamins ?Reviewed n/v relief measures and warning s/s to report ?Reviewed recommended weight gain based on pre-gravid BMI ?Encouraged well-balanced diet ?Genetic & carrier screening discussed: requests Panorama and Horizon , too late for NT/IT and AFP ?Ultrasound discussed; fetal survey: results reviewed ?CCNC completed> form faxed if has or is planning to apply for medicaid ?The nature of [place] for [facility] with multiple MDs and other Advanced Practice Providers was explained to patient; also emphasized that fellows, residents, and students are part of our team. ?Does not have home bp cuff. Office bp cuff given: no. Rx sent: yes. Check bp weekly, let us know if consistently >140/90.  ? ?Follow-up: Return for ASAP, PN2 and nurse bp check- then 2wks visit w/ md/cnm and pap.  ? ?Orders Placed This Encounter  ?Procedures  ? Urine Culture  ? GC/Chlamydia Probe Amp  ? Protein / creatinine ratio, urine  ? CBC/D/Plt+RPR+Rh+ABO+RubIgG...  ? Comprehensive metabolic panel  ? Glucose Tolerance, 2 Hours w/1 Hour  ? POC Urinalysis Dipstick OB  ? ? ?[place], WHNP-BC ?05/16/2021 ?12:22 PM  ?

## 2021-05-16 NOTE — Progress Notes (Signed)
[place] 27+5 wks,cephalic,anterior placenta gr 2,normal ovaries,FHR 159 BPM,EFW 1205 g,CX 2.9 cm,AFI 18 cm,anatomy complete,no obvious abnormalities  ?

## 2021-05-17 ENCOUNTER — Ambulatory Visit: Payer: Medicaid Other

## 2021-05-17 ENCOUNTER — Other Ambulatory Visit: Payer: Medicaid Other

## 2021-05-17 MED ORDER — FERROUS SULFATE 325 (65 FE) MG PO TABS: 325.0000 mg | ORAL_TABLET | ORAL | 2 refills | Status: DC

## 2021-05-17 NOTE — Addendum Note (Signed)
Addended by: Wells Guiles R on: 05/17/2021 01:54 PM ? ? Modules accepted: Orders ? ?

## 2021-05-18 LAB — COMPREHENSIVE METABOLIC PANEL
ALT: 12 IU/L (ref 0–32)
AST: 17 IU/L (ref 0–40)
Albumin/Globulin Ratio: 1.4 (ref 1.2–2.2)
Albumin: 3.6 g/dL — ABNORMAL LOW (ref 3.9–5.0)
Alkaline Phosphatase: 127 IU/L — ABNORMAL HIGH (ref 44–121)
BUN/Creatinine Ratio: 12 (ref 9–23)
BUN: 6 mg/dL (ref 6–20)
Bilirubin Total: <0.2 mg/dL (ref 0.0–1.2)
CO2: 21 mmol/L (ref 20–29)
Calcium: 8.7 mg/dL (ref 8.7–10.2)
Chloride: 103 mmol/L (ref 96–106)
Creatinine, Ser: 0.52 mg/dL — ABNORMAL LOW (ref 0.57–1.00)
Globulin, Total: 2.5 g/dL (ref 1.5–4.5)
Glucose: 93 mg/dL (ref 70–99)
Potassium: 4.3 mmol/L (ref 3.5–5.2)
Sodium: 138 mmol/L (ref 134–144)
Total Protein: 6.1 g/dL (ref 6.0–8.5)
eGFR: 131 mL/min/{1.73_m2} (ref >59)

## 2021-05-18 LAB — CBC/D/PLT+RPR+RH+ABO+RUBIGG...
Antibody Screen: NEGATIVE
Basophils Absolute: 0.1 10*3/uL (ref 0.0–0.2)
Basos: 1 %
EOS (ABSOLUTE): 0.3 10*3/uL (ref 0.0–0.4)
Eos: 2 %
HCV Ab: NONREACTIVE
HIV Screen 4th Generation wRfx: NONREACTIVE
Hematocrit: 31.1 % — ABNORMAL LOW (ref 34.0–46.6)
Hemoglobin: 10.4 g/dL — ABNORMAL LOW (ref 11.1–15.9)
Hepatitis B Surface Ag: NEGATIVE
Immature Grans (Abs): 0.6 10*3/uL — ABNORMAL HIGH (ref 0.0–0.1)
Immature Granulocytes: 3 %
Lymphocytes Absolute: 2.9 10*3/uL (ref 0.7–3.1)
Lymphs: 15 %
MCH: 29.1 pg (ref 26.6–33.0)
MCHC: 33.4 g/dL (ref 31.5–35.7)
MCV: 87 fL (ref 79–97)
Monocytes Absolute: 0.9 10*3/uL (ref 0.1–0.9)
Monocytes: 4 %
Neutrophils Absolute: 14.8 10*3/uL — ABNORMAL HIGH (ref 1.4–7.0)
Neutrophils: 75 %
Platelets: 307 10*3/uL (ref 150–450)
RBC: 3.57 x10E6/uL — ABNORMAL LOW (ref 3.77–5.28)
RDW: 11.8 % (ref 11.7–15.4)
RPR Ser Ql: NONREACTIVE
Rh Factor: POSITIVE
Rubella Antibodies, IGG: 1 index (ref >0.99)
WBC: 19.5 10*3/uL — ABNORMAL HIGH (ref 3.4–10.8)

## 2021-05-18 LAB — HCV INTERPRETATION

## 2021-05-18 LAB — GC/CHLAMYDIA PROBE AMP
Chlamydia trachomatis, NAA: NEGATIVE
Neisseria Gonorrhoeae by PCR: NEGATIVE

## 2021-05-18 LAB — PROTEIN / CREATININE RATIO, URINE
Creatinine, Urine: 198.9 mg/dL
Protein, Ur: 205.6 mg/dL
Protein/Creat Ratio: 1034 mg/g creat — ABNORMAL HIGH (ref 0–200)

## 2021-05-20 DIAGNOSIS — R8271 Bacteriuria: Secondary | ICD-10-CM | POA: Insufficient documentation

## 2021-05-20 LAB — URINE CULTURE

## 2021-05-20 MED ORDER — NITROFURANTOIN MONOHYD MACRO 100 MG PO CAPS
100.0000 mg | ORAL_CAPSULE | Freq: Two times a day (BID) | ORAL | 0 refills | Status: DC
Start: 2021-05-20 — End: 2021-08-28

## 2021-05-20 NOTE — Addendum Note (Signed)
Addended by: Shawna Clamp R on: 05/20/2021 01:44 PM ? ? Modules accepted: Orders ? ?

## 2021-05-22 ENCOUNTER — Encounter: Payer: Medicaid Other | Admitting: Obstetrics & Gynecology

## 2021-05-22 ENCOUNTER — Encounter: Payer: Self-pay | Admitting: *Deleted

## 2021-05-22 ENCOUNTER — Other Ambulatory Visit: Payer: Medicaid Other

## 2021-05-22 DIAGNOSIS — Z3483 Encounter for supervision of other normal pregnancy, third trimester: Secondary | ICD-10-CM

## 2021-05-22 DIAGNOSIS — Z3A28 28 weeks gestation of pregnancy: Secondary | ICD-10-CM

## 2021-05-22 DIAGNOSIS — Z131 Encounter for screening for diabetes mellitus: Secondary | ICD-10-CM

## 2021-05-23 ENCOUNTER — Other Ambulatory Visit: Payer: Medicaid Other

## 2021-05-31 ENCOUNTER — Other Ambulatory Visit: Payer: Medicaid Other

## 2021-05-31 ENCOUNTER — Encounter: Payer: Medicaid Other | Admitting: Women's Health

## 2021-06-04 LAB — GLUCOSE TOLERANCE, 2 HOURS W/ 1HR
Glucose, 1 hour: 209 mg/dL — ABNORMAL HIGH (ref 70–179)
Glucose, 2 hour: 159 mg/dL — ABNORMAL HIGH (ref 70–152)
Glucose, Fasting: 105 mg/dL — ABNORMAL HIGH (ref 70–91)

## 2021-06-05 ENCOUNTER — Other Ambulatory Visit: Payer: Self-pay | Admitting: *Deleted

## 2021-06-05 DIAGNOSIS — O24419 Gestational diabetes mellitus in pregnancy, unspecified control: Secondary | ICD-10-CM | POA: Insufficient documentation

## 2021-06-05 DIAGNOSIS — O24410 Gestational diabetes mellitus in pregnancy, diet controlled: Secondary | ICD-10-CM

## 2021-06-05 DIAGNOSIS — O0993 Supervision of high risk pregnancy, unspecified, third trimester: Secondary | ICD-10-CM

## 2021-06-05 MED ORDER — ACCU-CHEK SOFTCLIX LANCETS MISC: 12 refills | Status: DC

## 2021-06-05 MED ORDER — ACCU-CHEK GUIDE VI STRP: ORAL_STRIP | 12 refills | Status: DC

## 2021-06-05 MED ORDER — ACCU-CHEK GUIDE ME W/DEVICE KIT: 1.0000 | PACK | Freq: Four times a day (QID) | 0 refills | Status: DC

## 2021-06-05 NOTE — Progress Notes (Signed)
Spoke with patient.  Contact information updated.  Informed we had been trying to reach several times regarding recent labwork.  Informed she had a positive urine culture on 3/2 in which antibiotics had been sent in to her pharmacy.  States she was unaware and had not taken the medication.  Also informed she did not pass her glucose test.  Verified she had not eaten after midnight prior to the test and patient stated she had not.  Discussed how and when to check her blood sugar, logging readings and importance of coming to her visits.  Also made aware that we needed to have her come in for a blood pressure check as soon as possible since at her visit on 3/2 it was elevated.  Patient placed on schedule for tomorrow and stated she would try to make it.  Discussed the risks of elevated blood pressures and importance of prenatal care. Future appointment made for ultrasound and visit. ?

## 2021-06-06 ENCOUNTER — Telehealth: Payer: Self-pay | Admitting: Family Medicine

## 2021-06-06 NOTE — Telephone Encounter (Signed)
Called patient to schedule diabetes education visit, there was no answer to the phone call so a voicemail was left with the call back number for the office.  ?

## 2021-06-11 ENCOUNTER — Other Ambulatory Visit: Payer: Self-pay | Admitting: Obstetrics & Gynecology

## 2021-06-11 DIAGNOSIS — F1910 Other psychoactive substance abuse, uncomplicated: Secondary | ICD-10-CM

## 2021-06-11 DIAGNOSIS — O24419 Gestational diabetes mellitus in pregnancy, unspecified control: Secondary | ICD-10-CM

## 2021-06-12 ENCOUNTER — Ambulatory Visit (INDEPENDENT_AMBULATORY_CARE_PROVIDER_SITE_OTHER): Payer: Medicaid Other

## 2021-06-12 ENCOUNTER — Encounter: Payer: Self-pay | Admitting: Advanced Practice Midwife

## 2021-06-12 ENCOUNTER — Ambulatory Visit (INDEPENDENT_AMBULATORY_CARE_PROVIDER_SITE_OTHER): Payer: Self-pay | Admitting: Advanced Practice Midwife

## 2021-06-12 VITALS — BP 159/105 | HR 108 | Wt 124.0 lb

## 2021-06-12 DIAGNOSIS — O24419 Gestational diabetes mellitus in pregnancy, unspecified control: Secondary | ICD-10-CM | POA: Diagnosis not present

## 2021-06-12 DIAGNOSIS — F1910 Other psychoactive substance abuse, uncomplicated: Secondary | ICD-10-CM | POA: Diagnosis not present

## 2021-06-12 DIAGNOSIS — O0992 Supervision of high risk pregnancy, unspecified, second trimester: Secondary | ICD-10-CM

## 2021-06-12 DIAGNOSIS — O0932 Supervision of pregnancy with insufficient antenatal care, second trimester: Secondary | ICD-10-CM

## 2021-06-12 DIAGNOSIS — Z3A31 31 weeks gestation of pregnancy: Secondary | ICD-10-CM

## 2021-06-12 DIAGNOSIS — O169 Unspecified maternal hypertension, unspecified trimester: Secondary | ICD-10-CM

## 2021-06-12 DIAGNOSIS — O0993 Supervision of high risk pregnancy, unspecified, third trimester: Secondary | ICD-10-CM

## 2021-06-12 LAB — POCT URINALYSIS DIPSTICK OB
Blood, UA: NEGATIVE
Glucose, UA: NEGATIVE
Ketones, UA: NEGATIVE
Nitrite, UA: POSITIVE

## 2021-06-12 NOTE — Progress Notes (Signed)
[place] 31+4 wks,breech,fhr 177 bpm,cx 3.6 cm,anterior placenta gr 3,AFI 21 cm,EFW 1972 g 66%,AC 96% ?

## 2021-06-12 NOTE — Progress Notes (Signed)
? ?HIGH-RISK PREGNANCY VISIT ?Patient name: Laura Wagner MRN [id number]  Date of birth: 1995/03/25 ?Chief Complaint:   ?Routine Prenatal Visit ? ?History of Present Illness:   ?Laura Wagner is a 26 y.o. G60P1011 female at [redacted]w[redacted]d with an Estimated Date of Delivery: 08/10/21 being seen today for ongoing management of a high-risk pregnancy complicated by GDM and severe range BPs today.   ? ?Today she reports  no complaints; denies H/A, visual disturbances or RUQ pain; hasn't been to diabetic ed or made appt yet; hasn't picked up glucometer; hasn't picked up [place] for E coli UTI dx 05/16/21; states last drug use besides THC was first week in March   . Contractions: Not present. Vag. Bleeding: None.  Movement: Present. denies leaking of fluid.  ? ? ?  05/16/2021  ? 11:02 AM 07/30/2016  ?  1:54 PM 07/09/2016  ?  2:16 PM 07/09/2016  ?  2:15 PM  ?Depression screen PHQ 2/9  ?Decreased Interest 0 1 2 2   ?Down, Depressed, Hopeless 1 0 1 1  ?PHQ - 2 Score 1 1 3 3   ?Altered sleeping 0 1 3   ?Tired, decreased energy 1 1 3    ?Change in appetite 0 0 0   ?Feeling bad or failure about yourself  0 0 0   ?Trouble concentrating 0 0 0   ?Moving slowly or fidgety/restless 0 0 1   ?Suicidal thoughts 0 0 0   ?PHQ-9 Score 2 3 10    ? ?  ? ?  05/16/2021  ? 11:03 AM  ?GAD 7 : Generalized Anxiety Score  ?Nervous, Anxious, on Edge 1  ?Control/stop worrying 0  ?Worry too much - different things 0  ?Trouble relaxing 1  ?Restless 2  ?Easily annoyed or irritable 2  ?Afraid - awful might happen 0  ?Total GAD 7 Score 6  ? ? ? ?Review of Systems:   ?Pertinent items are noted in HPI ?Denies abnormal vaginal discharge w/ itching/odor/irritation, headaches, visual changes, shortness of breath, chest pain, abdominal pain, severe nausea/vomiting, or problems with urination or bowel movements unless otherwise stated above. ?Pertinent History Reviewed:  ?Reviewed past medical,surgical, social, obstetrical and family history.  ?Reviewed problem list,  medications and allergies. ?Physical Assessment:  ? ?Vitals:  ? 06/12/21 1443 06/12/21 1448  ?BP: (!) 158/109 (!) 159/105  ?Pulse: (!) 102 (!) 108  ?Weight: 124 lb (56.2 kg)   ?Body mass index is 25.04 kg/m?. ?     ?     Physical Examination:  ? General appearance: quiet and a bit uncomfortable/restless ? Mental status: drowsy ? Skin: warm & dry  ? Extremities: Edema: None  ?  Cardiovascular: normal heart rate noted ? Respiratory: normal respiratory effort, no distress ? Abdomen: gravid, soft, non-tender ? Pelvic: Cervical exam deferred        ? ?Fetal Status: Fetal Heart Rate (bpm): 177 u/s   Movement: Present   ? ?Fetal Surveillance Testing today: [place] 31+4 wks,breech,fhr 177 bpm,cx 3.6 cm,anterior placenta gr 3,AFI 21 cm,EFW 1972 g 66%,AC 96%  ? ? ?Results for orders placed or performed in visit on 06/12/21 (from the past 24 hour(s))  ?POC Urinalysis Dipstick OB  ? Collection Time: 06/12/21  3:00 PM  ?Result Value Ref Range  ? Color, UA    ? Clarity, UA    ? Glucose, UA Negative Negative  ? Bilirubin, UA    ? Ketones, UA neg   ? Spec Grav, UA    ? Blood, UA neg   ?  pH, UA    ? POC,PROTEIN,UA 4+ Negative, Trace, Small (1+), Moderate (2+), Large (3+), 4+  ? Urobilinogen, UA    ? Nitrite, UA positive   ? Leukocytes, UA Small (1+) (A) Negative  ? Appearance    ? Odor    ?  ?Assessment & Plan:  ?High-risk pregnancy: G3P1011 at [redacted]w[redacted]d with an Estimated Date of Delivery: 08/10/21  ? ?1) Severe range BPs today indicating severe pre-e, no symptoms; has 4+ proteinuria (also w nitrites) ? ?2) GDM, no CBG values; growth today 66th% ? ?3) Late to care @ 28 wks with 2nd visit today ? ?4) Illicit drug use, last use first week in March (besides [facility]) ? ?5) Untreated UTI from 05/20/21, no s/s pyelo, hasn't picked up [place] ? ?Meds: No orders of the defined types were placed in this encounter. ? ? ?Labs/procedures today: U/S ? ?Treatment Plan:  to MAU for eval of BPs ? ?Reviewed: Preterm labor symptoms and general obstetric precautions  including but not limited to vaginal bleeding, contractions, leaking of fluid and fetal movement were reviewed in detail with the patient.  All questions were answered.  ? ? ?Follow-up: Return in about 1 week (around 06/19/2021) for [facility] MD only. ? ? ?Future Appointments  ?Date Time Provider [place]  ?06/20/2021  2:50 PM Eure, Mertie Clause, MD [facility]  ? ? ?Orders Placed This Encounter  ?Procedures  ? POC Urinalysis Dipstick OB  ? ?Myrtis Ser CNM ?06/12/2021 ?3:04 PM  ?

## 2021-06-13 ENCOUNTER — Telehealth: Payer: Self-pay | Admitting: *Deleted

## 2021-06-13 NOTE — Telephone Encounter (Signed)
LMOVM checking on patient to see if she went to any hospital yesterday for evaluation.  Strongly encouraged patient to seek any emergency room for evaluation and could call our office if she had any questions.  ?

## 2021-06-20 ENCOUNTER — Encounter: Payer: Medicaid Other | Admitting: Obstetrics & Gynecology

## 2021-06-27 ENCOUNTER — Encounter: Payer: Medicaid Other | Admitting: Obstetrics & Gynecology

## 2021-08-27 DIAGNOSIS — I2699 Other pulmonary embolism without acute cor pulmonale: Secondary | ICD-10-CM

## 2021-08-27 HISTORY — DX: Other pulmonary embolism without acute cor pulmonale: I26.99

## 2021-08-28 ENCOUNTER — Emergency Department (HOSPITAL_COMMUNITY): Payer: Medicaid Other

## 2021-08-28 ENCOUNTER — Encounter (HOSPITAL_COMMUNITY): Payer: Self-pay | Admitting: Emergency Medicine

## 2021-08-28 ENCOUNTER — Other Ambulatory Visit: Payer: Self-pay

## 2021-08-28 ENCOUNTER — Other Ambulatory Visit (HOSPITAL_COMMUNITY): Payer: Self-pay | Admitting: *Deleted

## 2021-08-28 ENCOUNTER — Inpatient Hospital Stay (HOSPITAL_COMMUNITY)
Admission: EM | Admit: 2021-08-28 | Discharge: 2021-08-31 | DRG: 776 | Disposition: A | Discharge status: Home / Self Care | Payer: Medicaid Other | Attending: Family Medicine | Admitting: Family Medicine

## 2021-08-28 ENCOUNTER — Inpatient Hospital Stay (HOSPITAL_COMMUNITY): Payer: Medicaid Other

## 2021-08-28 DIAGNOSIS — O8883 Other embolism in the puerperium: Secondary | ICD-10-CM | POA: Diagnosis present

## 2021-08-28 DIAGNOSIS — O9081 Anemia of the puerperium: Secondary | ICD-10-CM | POA: Diagnosis present

## 2021-08-28 DIAGNOSIS — I110 Hypertensive heart disease with heart failure: Secondary | ICD-10-CM | POA: Diagnosis present

## 2021-08-28 DIAGNOSIS — O8823 Thromboembolism in the puerperium: Secondary | ICD-10-CM | POA: Diagnosis present

## 2021-08-28 DIAGNOSIS — F1510 Other stimulant abuse, uncomplicated: Secondary | ICD-10-CM | POA: Diagnosis present

## 2021-08-28 DIAGNOSIS — O1013 Pre-existing hypertensive heart disease complicating the puerperium: Principal | ICD-10-CM | POA: Diagnosis present

## 2021-08-28 DIAGNOSIS — O904 Postpartum acute kidney failure: Secondary | ICD-10-CM | POA: Diagnosis present

## 2021-08-28 DIAGNOSIS — F1210 Cannabis abuse, uncomplicated: Secondary | ICD-10-CM | POA: Diagnosis present

## 2021-08-28 DIAGNOSIS — I10 Essential (primary) hypertension: Secondary | ICD-10-CM | POA: Diagnosis not present

## 2021-08-28 DIAGNOSIS — E871 Hypo-osmolality and hyponatremia: Secondary | ICD-10-CM | POA: Diagnosis not present

## 2021-08-28 DIAGNOSIS — O99325 Drug use complicating the puerperium: Secondary | ICD-10-CM | POA: Diagnosis present

## 2021-08-28 DIAGNOSIS — Z28310 Unvaccinated for covid-19: Secondary | ICD-10-CM | POA: Diagnosis not present

## 2021-08-28 DIAGNOSIS — I509 Heart failure, unspecified: Secondary | ICD-10-CM | POA: Diagnosis not present

## 2021-08-28 DIAGNOSIS — N390 Urinary tract infection, site not specified: Secondary | ICD-10-CM | POA: Diagnosis present

## 2021-08-28 DIAGNOSIS — D649 Anemia, unspecified: Secondary | ICD-10-CM | POA: Diagnosis present

## 2021-08-28 DIAGNOSIS — F1910 Other psychoactive substance abuse, uncomplicated: Secondary | ICD-10-CM | POA: Diagnosis not present

## 2021-08-28 DIAGNOSIS — R7989 Other specified abnormal findings of blood chemistry: Secondary | ICD-10-CM | POA: Diagnosis present

## 2021-08-28 DIAGNOSIS — J810 Acute pulmonary edema: Principal | ICD-10-CM

## 2021-08-28 DIAGNOSIS — I5021 Acute systolic (congestive) heart failure: Secondary | ICD-10-CM | POA: Diagnosis present

## 2021-08-28 DIAGNOSIS — R7401 Elevation of levels of liver transaminase levels: Secondary | ICD-10-CM | POA: Diagnosis not present

## 2021-08-28 DIAGNOSIS — O8620 Urinary tract infection following delivery, unspecified: Secondary | ICD-10-CM | POA: Diagnosis present

## 2021-08-28 DIAGNOSIS — D500 Iron deficiency anemia secondary to blood loss (chronic): Secondary | ICD-10-CM | POA: Diagnosis present

## 2021-08-28 DIAGNOSIS — F17210 Nicotine dependence, cigarettes, uncomplicated: Secondary | ICD-10-CM | POA: Diagnosis present

## 2021-08-28 DIAGNOSIS — O9089 Other complications of the puerperium, not elsewhere classified: Secondary | ICD-10-CM | POA: Diagnosis present

## 2021-08-28 DIAGNOSIS — E876 Hypokalemia: Secondary | ICD-10-CM | POA: Diagnosis not present

## 2021-08-28 DIAGNOSIS — I2699 Other pulmonary embolism without acute cor pulmonale: Secondary | ICD-10-CM | POA: Diagnosis not present

## 2021-08-28 DIAGNOSIS — E8809 Other disorders of plasma-protein metabolism, not elsewhere classified: Secondary | ICD-10-CM | POA: Diagnosis not present

## 2021-08-28 DIAGNOSIS — O9943 Diseases of the circulatory system complicating the puerperium: Secondary | ICD-10-CM | POA: Diagnosis present

## 2021-08-28 DIAGNOSIS — Z825 Family history of asthma and other chronic lower respiratory diseases: Secondary | ICD-10-CM

## 2021-08-28 DIAGNOSIS — I272 Pulmonary hypertension, unspecified: Secondary | ICD-10-CM | POA: Diagnosis present

## 2021-08-28 DIAGNOSIS — N179 Acute kidney failure, unspecified: Secondary | ICD-10-CM | POA: Diagnosis present

## 2021-08-28 DIAGNOSIS — O99335 Smoking (tobacco) complicating the puerperium: Secondary | ICD-10-CM | POA: Diagnosis present

## 2021-08-28 DIAGNOSIS — I2693 Single subsegmental pulmonary embolism without acute cor pulmonale: Secondary | ICD-10-CM

## 2021-08-28 DIAGNOSIS — F17200 Nicotine dependence, unspecified, uncomplicated: Secondary | ICD-10-CM | POA: Diagnosis not present

## 2021-08-28 DIAGNOSIS — Z7982 Long term (current) use of aspirin: Secondary | ICD-10-CM

## 2021-08-28 HISTORY — DX: Essential (primary) hypertension: I10

## 2021-08-28 HISTORY — DX: Gestational diabetes mellitus in pregnancy, unspecified control: O24.419

## 2021-08-28 LAB — TYPE AND SCREEN
ABO/RH(D): O POS
Antibody Screen: NEGATIVE

## 2021-08-28 LAB — COMPREHENSIVE METABOLIC PANEL
ALT: 45 U/L — ABNORMAL HIGH (ref 0–44)
ALT: 45 U/L — ABNORMAL HIGH (ref 0–44)
AST: 56 U/L — ABNORMAL HIGH (ref 15–41)
AST: 47 U/L — ABNORMAL HIGH (ref 15–41)
Albumin: 2.9 g/dL — ABNORMAL LOW (ref 3.5–5.0)
Albumin: 3.2 g/dL — ABNORMAL LOW (ref 3.5–5.0)
Alkaline Phosphatase: 116 U/L (ref 38–126)
Alkaline Phosphatase: 124 U/L (ref 38–126)
Anion gap: 7 (ref 5–15)
Anion gap: 9 (ref 5–15)
BUN: 16 mg/dL (ref 6–20)
BUN: 13 mg/dL (ref 6–20)
CO2: 25 mmol/L (ref 22–32)
CO2: 28 mmol/L (ref 22–32)
Calcium: 8.1 mg/dL — ABNORMAL LOW (ref 8.9–10.3)
Calcium: 8.7 mg/dL — ABNORMAL LOW (ref 8.9–10.3)
Chloride: 105 mmol/L (ref 98–111)
Chloride: 98 mmol/L (ref 98–111)
Creatinine, Ser: 1.12 mg/dL — ABNORMAL HIGH (ref 0.44–1.00)
Creatinine, Ser: 0.98 mg/dL (ref 0.44–1.00)
GFR, Estimated: >60 mL/min (ref >60)
GFR, Estimated: >60 mL/min (ref >60)
Glucose, Bld: 109 mg/dL — ABNORMAL HIGH (ref 70–99)
Glucose, Bld: 123 mg/dL — ABNORMAL HIGH (ref 70–99)
Potassium: 3.6 mmol/L (ref 3.5–5.1)
Potassium: 3.2 mmol/L — ABNORMAL LOW (ref 3.5–5.1)
Sodium: 137 mmol/L (ref 135–145)
Sodium: 135 mmol/L (ref 135–145)
Total Bilirubin: 0.4 mg/dL (ref 0.3–1.2)
Total Bilirubin: 0.6 mg/dL (ref 0.3–1.2)
Total Protein: 5.9 g/dL — ABNORMAL LOW (ref 6.5–8.1)
Total Protein: 6.5 g/dL (ref 6.5–8.1)

## 2021-08-28 LAB — ECHOCARDIOGRAM COMPLETE
Area-P 1/2: 9.37 cm2
Calc EF: 35.9 %
Height: 59 in
MV M vel: 5.78 m/s
MV Peak grad: 133.6 mmHg
S' Lateral: 3.9 cm
Single Plane A2C EF: 42.7 %
Single Plane A4C EF: 32.2 %
Weight: 1840 oz

## 2021-08-28 LAB — CBC WITH DIFFERENTIAL/PLATELET
Abs Immature Granulocytes: 0.04 10*3/uL (ref 0.00–0.07)
Basophils Absolute: 0.1 10*3/uL (ref 0.0–0.1)
Basophils Relative: 1 %
Eosinophils Absolute: 0.2 10*3/uL (ref 0.0–0.5)
Eosinophils Relative: 2 %
HCT: 24.7 % — ABNORMAL LOW (ref 36.0–46.0)
Hemoglobin: 7.1 g/dL — ABNORMAL LOW (ref 12.0–15.0)
Immature Granulocytes: 0 %
Lymphocytes Relative: 34 %
Lymphs Abs: 3.4 10*3/uL (ref 0.7–4.0)
MCH: 23.4 pg — ABNORMAL LOW (ref 26.0–34.0)
MCHC: 28.7 g/dL — ABNORMAL LOW (ref 30.0–36.0)
MCV: 81.5 fL (ref 80.0–100.0)
Monocytes Absolute: 0.4 10*3/uL (ref 0.1–1.0)
Monocytes Relative: 4 %
Neutro Abs: 5.9 10*3/uL (ref 1.7–7.7)
Neutrophils Relative %: 59 %
Platelets: 420 10*3/uL — ABNORMAL HIGH (ref 150–400)
RBC: 3.03 MIL/uL — ABNORMAL LOW (ref 3.87–5.11)
RDW: 19 % — ABNORMAL HIGH (ref 11.5–15.5)
WBC: 9.9 10*3/uL (ref 4.0–10.5)
nRBC: 0.2 % (ref 0.0–0.2)

## 2021-08-28 LAB — CBC
HCT: 27.5 % — ABNORMAL LOW (ref 36.0–46.0)
Hemoglobin: 8 g/dL — ABNORMAL LOW (ref 12.0–15.0)
MCH: 23.5 pg — ABNORMAL LOW (ref 26.0–34.0)
MCHC: 29.1 g/dL — ABNORMAL LOW (ref 30.0–36.0)
MCV: 80.9 fL (ref 80.0–100.0)
Platelets: 451 10*3/uL — ABNORMAL HIGH (ref 150–400)
RBC: 3.4 MIL/uL — ABNORMAL LOW (ref 3.87–5.11)
RDW: 19.1 % — ABNORMAL HIGH (ref 11.5–15.5)
WBC: 8.4 10*3/uL (ref 4.0–10.5)
nRBC: 0 % (ref 0.0–0.2)

## 2021-08-28 LAB — RETICULOCYTES
Immature Retic Fract: 32 % — ABNORMAL HIGH (ref 2.3–15.9)
RBC.: 3.48 MIL/uL — ABNORMAL LOW (ref 3.87–5.11)
Retic Count, Absolute: 93.3 10*3/uL (ref 19.0–186.0)
Retic Ct Pct: 2.7 % (ref 0.4–3.1)

## 2021-08-28 LAB — FOLATE: Folate: 6.6 ng/mL (ref >5.9)

## 2021-08-28 LAB — VITAMIN B12: Vitamin B-12: 170 pg/mL — ABNORMAL LOW (ref 180–914)

## 2021-08-28 LAB — TROPONIN I (HIGH SENSITIVITY)
Troponin I (High Sensitivity): 28 ng/L — ABNORMAL HIGH (ref <18)
Troponin I (High Sensitivity): 34 ng/L — ABNORMAL HIGH (ref <18)

## 2021-08-28 LAB — IRON AND TIBC
Iron: 15 ug/dL — ABNORMAL LOW (ref 28–170)
Saturation Ratios: 3 % — ABNORMAL LOW (ref 10.4–31.8)
TIBC: 450 ug/dL (ref 250–450)
UIBC: 435 ug/dL

## 2021-08-28 LAB — BRAIN NATRIURETIC PEPTIDE: B Natriuretic Peptide: 1647 pg/mL — ABNORMAL HIGH (ref 0.0–100.0)

## 2021-08-28 LAB — HEPARIN LEVEL (UNFRACTIONATED)
Heparin Unfractionated: 0.21 IU/mL — ABNORMAL LOW (ref 0.30–0.70)
Heparin Unfractionated: 0.12 IU/mL — ABNORMAL LOW (ref 0.30–0.70)

## 2021-08-28 LAB — TSH: TSH: 1.538 u[IU]/mL (ref 0.350–4.500)

## 2021-08-28 LAB — HIV ANTIBODY (ROUTINE TESTING W REFLEX): HIV Screen 4th Generation wRfx: NONREACTIVE

## 2021-08-28 LAB — FERRITIN: Ferritin: 8 ng/mL — ABNORMAL LOW (ref 11–307)

## 2021-08-28 MED ORDER — FUROSEMIDE 10 MG/ML IJ SOLN
40.0000 mg | Freq: Once | INTRAMUSCULAR | Status: AC
  Administered 2021-08-28: 40 mg via INTRAVENOUS
  Filled 2021-08-28: qty 4

## 2021-08-28 MED ORDER — SODIUM CHLORIDE 0.9 % IV SOLN
1.0000 g | INTRAVENOUS | Status: AC
  Administered 2021-08-28 – 2021-08-30 (×3): 1 g via INTRAVENOUS
  Filled 2021-08-28 (×3): qty 10

## 2021-08-28 MED ORDER — ALBUTEROL SULFATE (2.5 MG/3ML) 0.083% IN NEBU: 2.5000 mg | INHALATION_SOLUTION | RESPIRATORY_TRACT | Status: DC | PRN

## 2021-08-28 MED ORDER — OXYCODONE HCL 5 MG PO TABS: 5.0000 mg | ORAL_TABLET | ORAL | Status: DC | PRN

## 2021-08-28 MED ORDER — AMLODIPINE BESYLATE 5 MG PO TABS: 5.0000 mg | ORAL_TABLET | Freq: Every day | ORAL | Status: DC

## 2021-08-28 MED ORDER — CARVEDILOL 3.125 MG PO TABS
6.2500 mg | ORAL_TABLET | Freq: Two times a day (BID) | ORAL | Status: DC
  Administered 2021-08-28 – 2021-08-30 (×5): 6.25 mg via ORAL
  Filled 2021-08-28 (×5): qty 2

## 2021-08-28 MED ORDER — MORPHINE SULFATE (PF) 2 MG/ML IV SOLN: 2.0000 mg | INTRAVENOUS | Status: DC | PRN

## 2021-08-28 MED ORDER — FERROUS SULFATE 325 (65 FE) MG PO TABS
325.0000 mg | ORAL_TABLET | Freq: Every day | ORAL | Status: DC
  Administered 2021-08-29 – 2021-08-31 (×3): 325 mg via ORAL
  Filled 2021-08-28 (×3): qty 1

## 2021-08-28 MED ORDER — FUROSEMIDE 10 MG/ML IJ SOLN
40.0000 mg | Freq: Two times a day (BID) | INTRAMUSCULAR | Status: DC
Start: 2021-08-28 — End: 2021-08-31
  Administered 2021-08-28 – 2021-08-31 (×6): 40 mg via INTRAVENOUS
  Filled 2021-08-28 (×6): qty 4

## 2021-08-28 MED ORDER — ONDANSETRON HCL 4 MG PO TABS: 4.0000 mg | ORAL_TABLET | Freq: Four times a day (QID) | ORAL | Status: DC | PRN

## 2021-08-28 MED ORDER — HEPARIN BOLUS VIA INFUSION
750.0000 [IU] | Freq: Once | INTRAVENOUS | Status: AC
  Administered 2021-08-28: 750 [IU] via INTRAVENOUS
  Filled 2021-08-28: qty 750

## 2021-08-28 MED ORDER — ONDANSETRON HCL 4 MG/2ML IJ SOLN: 4.0000 mg | Freq: Four times a day (QID) | INTRAMUSCULAR | Status: DC | PRN

## 2021-08-28 MED ORDER — FERROUS SULFATE 325 (65 FE) MG PO TABS
325.0000 mg | ORAL_TABLET | ORAL | Status: DC
  Administered 2021-08-28: 325 mg via ORAL
  Filled 2021-08-28: qty 1

## 2021-08-28 MED ORDER — HYDRALAZINE HCL 20 MG/ML IJ SOLN: 10.0000 mg | INTRAMUSCULAR | Status: DC | PRN

## 2021-08-28 MED ORDER — ACETAMINOPHEN 325 MG PO TABS: 650.0000 mg | ORAL_TABLET | Freq: Four times a day (QID) | ORAL | Status: DC | PRN

## 2021-08-28 MED ORDER — ACETAMINOPHEN 650 MG RE SUPP: 650.0000 mg | Freq: Four times a day (QID) | RECTAL | Status: DC | PRN

## 2021-08-28 MED ORDER — HEPARIN (PORCINE) 25000 UT/250ML-% IV SOLN
1150.0000 [IU]/h | INTRAVENOUS | Status: DC
Start: 2021-08-28 — End: 2021-08-31
  Administered 2021-08-28: 900 [IU]/h via INTRAVENOUS
  Administered 2021-08-29 – 2021-08-31 (×3): 1150 [IU]/h via INTRAVENOUS
  Filled 2021-08-28 (×4): qty 250

## 2021-08-28 MED ORDER — NICOTINE 14 MG/24HR TD PT24
14.0000 mg | MEDICATED_PATCH | Freq: Every day | TRANSDERMAL | Status: DC
Start: 2021-08-28 — End: 2021-08-31
  Administered 2021-08-28 – 2021-08-31 (×4): 14 mg via TRANSDERMAL
  Filled 2021-08-28 (×4): qty 1

## 2021-08-28 MED ORDER — LORAZEPAM 2 MG/ML IJ SOLN
0.5000 mg | Freq: Once | INTRAMUSCULAR | Status: AC
  Administered 2021-08-28: 0.5 mg via INTRAVENOUS
  Filled 2021-08-28: qty 1

## 2021-08-28 MED ORDER — ASPIRIN 81 MG PO TBEC
162.0000 mg | DELAYED_RELEASE_TABLET | Freq: Every day | ORAL | Status: DC
  Administered 2021-08-28: 162 mg via ORAL
  Filled 2021-08-28: qty 2

## 2021-08-28 MED ORDER — LIVING BETTER WITH HEART FAILURE BOOK: Freq: Once | Status: AC

## 2021-08-28 MED ORDER — HEPARIN BOLUS VIA INFUSION
2000.0000 [IU] | Freq: Once | INTRAVENOUS | Status: AC
  Administered 2021-08-28: 2000 [IU] via INTRAVENOUS

## 2021-08-28 NOTE — Progress Notes (Signed)
ANTICOAGULATION CONSULT NOTE - Initial Consult  Pharmacy Consult for heparin Indication:  r/o PE  No Known Allergies  Patient Measurements: Height: 4\' 11"  (149.9 cm) Weight: 52.2 kg (115 lb) IBW/kg (Calculated) : 43.2  Vital Signs: Temp: 98.1 F (36.7 C) (06/14 0340) Temp Source: Oral (06/14 0340) BP: 130/105 (06/14 0530) Pulse Rate: 118 (06/14 0530)  Labs: Recent Labs    08/28/21 0352  HGB 7.1*  HCT 24.7*  PLT 420*  CREATININE 1.12*  TROPONINIHS 28*    Estimated Creatinine Clearance: 55.7 mL/min (A) (by C-G formula based on SCr of 1.12 mg/dL (H)).   Medical History: Past Medical History:  Diagnosis Date   Gestational diabetes    Hypertension    Mental disorder     Assessment: 26yo postpartum female c/o SOB with cough >> had gone to OSH ED yesterday where CT revealed small RUL PE but pt left AMA, now presents to [facility] for treatment >> to begin heparin.  Of note pt has postpartum anemia; Hgb was 10.4 on day of delivery ~38mo ago 5/18 and was 6.9>7.4 on the days after, was supposed to take iron supplementation but has not started this.  Goal of Therapy:  Heparin level 0.3-0.7 units/ml Monitor platelets by anticoagulation protocol: Yes   Plan:  Smaller heparin bolus of 2000 units given anemia followed by infusion at 900 units/hr. Monitor heparin levels and CBC. F/u long-term AC plans.  6/18, PharmD, BCPS  08/28/2021,5:47 AM

## 2021-08-28 NOTE — Assessment & Plan Note (Signed)
-   Subsegmental PE on CTA -Not likely the cause of the CHF due to location and size of the PE -Heparin drip started -Continue to monitor

## 2021-08-28 NOTE — H&P (Signed)
History and Physical    Patient: Laura Wagner DOB: Jul 03, 1995 DOA: 08/28/2021 DOS: the patient was seen and examined on 08/28/2021 PCP: Patient, No Pcp Per (Inactive)  Patient coming from: Home  Chief Complaint:  Chief Complaint  Patient presents with   Shortness of Breath   HPI: Laura Wagner is a 26 y.o. female with medical history significant of gestational diabetes, hypertension, mood disorder, postpartum 3 weeks, presents ED with a chief complaint of dyspnea.  Patient reports that she has had swelling in her legs ever since she left the hospital after having her baby approximately 3 weeks ago.  That was at [facility].  Swelling has been progressively worse since it started.  Yesterday she started to have dyspnea.  It is worse on exertion.  She does not have orthopnea.  She admits to chest pain when she coughs.  She reports that she has had a productive cough with clear sputum.  She has had no fevers.  Patient reports palpitations when she feels anxious or when she coughs.  She has no history of asthma.  She does not use any inhaler at home.  She is a current smoker of half a pack per day.  Patient did present to [facility] ER before coming here.  It is unclear why she left that ER.  She did have a CT scan there that showed a very small subsegmental pulmonary embolism in the left upper lobe.  She had a moderate right pleural effusion and a small left pleural effusion.  She also had a UDS that was positive for amphetamines.  Patient adamantly denies using amphetamines.  She does report she uses marijuana but the last time was a week ago.  Patient denies nausea, abdominal pain, headache.  Patient has an elevated platelet count and a normal T. bili.  She does have a slight bump in her liver enzymes.  There is no abdominal tenderness on exam.  Patient is a current half a pack a day smoker.  She does not drink alcohol.  She uses marijuana, and has amphetamines on her  UDS.  She is not vaccinated for COVID.  Patient is full code. Review of Systems: As mentioned in the history of present illness. All other systems reviewed and are negative. Past Medical History:  Diagnosis Date   Gestational diabetes    Hypertension    Mental disorder    Past Surgical History:  Procedure Laterality Date   NO PAST SURGERIES     Social History:  reports that she has been smoking cigarettes. She has a 4.50 pack-year smoking history. She has never used smokeless tobacco. She reports current drug use. Drug: Marijuana. She reports that she does not drink alcohol.  No Known Allergies  Family History  Problem Relation Age of Onset   Asthma Brother    Cancer Maternal Grandmother        breast    Prior to Admission medications   Medication Sig Start Date End Date Taking? Authorizing Provider  Accu-Chek Softclix Lancets lancets Use as instructed to check blood sugar 4 times daily Patient not taking: Reported on 06/12/2021 06/05/21   Roma Schanz, CNM  aspirin 81 MG EC tablet Take 2 tablets (162 mg total) by mouth daily. Swallow whole. 05/16/21   Roma Schanz, CNM  Blood Glucose Monitoring Suppl (ACCU-CHEK GUIDE ME) w/Device KIT 1 each by Does not apply route 4 (four) times daily. Patient not taking: Reported on 06/12/2021 06/05/21   Gertie Exon,  Royetta Crochet, CNM  Blood Pressure Monitor MISC For regular home bp monitoring during pregnancy Patient not taking: Reported on 06/12/2021 05/16/21   Roma Schanz, CNM  ferrous sulfate 325 (65 FE) MG tablet Take 1 tablet (325 mg total) by mouth every other day. Patient not taking: Reported on 06/12/2021 05/17/21   Roma Schanz, CNM  glucose blood (ACCU-CHEK GUIDE) test strip Use as instructed to check blood sugar 4 times daily Patient not taking: Reported on 06/12/2021 06/05/21   Roma Schanz, CNM  nitrofurantoin, macrocrystal-monohydrate, (MACROBID) 100 MG capsule Take 1 capsule (100 mg total) by mouth 2 (two) times daily.  X 7 days Patient not taking: Reported on 06/12/2021 05/20/21   Roma Schanz, CNM    Physical Exam: Vitals:   08/28/21 0400 08/28/21 0430 08/28/21 0500 08/28/21 0530  BP: (!) 153/112 (!) 153/116 125/89 (!) 130/105  Pulse: (!) 113 (!) 125 (!) 117 (!) 118  Resp: (!) 39 (!) 24 (!) 29 (!) 21  Temp:      TempSrc:      SpO2: 100% 100% 97% 100%  Weight:      Height:       1.  General: Patient lying supine in bed, repositioning frequently as it is anxious or in pain  2. Psychiatric: Alert and oriented x 3, mood is irritable and behavior is normal for situation, pleasant and cooperative with exam   3. Neurologic: Speech and language are normal, face is symmetric, moves all 4 extremities voluntarily, at baseline without acute deficits on limited exam   4. HEENMT:  Head is atraumatic, normocephalic, pupils reactive to light, neck is supple, trachea is midline, mucous membranes are moist   5. Respiratory : Lungs are clear to auscultation bilaterally without wheezing, rhonchi, rales, no cyanosis, no increase in work of breathing or accessory muscle use   6. Cardiovascular : Heart rate normal, rhythm is regular,  rubs or gallops, bilateral lower extremity peripheral edema, peripheral pulses palpated   7. Gastrointestinal:  Abdomen is soft, nondistended, nontender to palpation bowel sounds active, no masses or organomegaly palpated   8. Skin:  Skin is warm, dry and intact without rashes, acute lesions, or ulcers on limited exam   9.Musculoskeletal:  No acute deformities or trauma, no asymmetry in tone, peripheral edema present, peripheral pulses palpated, no tenderness to palpation in the extremities  Data Reviewed: In the ED Temp 98.1, heart rate 113-125, respiratory rate 21-39, blood pressure 130/105 at admission, satting at 100% No leukocytosis with a white blood cell count of 9.9, hemoglobin 7.1, thrombocytosis of 420 Chemistry panel reveals a slightly elevated creatinine at  1.12 a low calcium that corrects for albumin, a bump in transaminases with an AST of 56, ALT 45, T. bili 0.4 BNP is 1647, initial troponin is 28 Chest x-ray shows cardiomegaly with decreased layering of the pleural effusion since yesterday EKG shows sinus tachycardia with a heart rate of 109, QTc 478 Patient was given 40 mg of IV Lasix and heparin was started Admission requested for new onset CHF  Assessment and Plan: * CHF (congestive heart failure) (HCC) Acute congestive heart failure exacerbation Major Criteria patient has cardiomegaly and pulmonary edema on chest imaging  Minor criteria patient has bilateral leg edema, dyspnea on exertion, pleural effusion  IV diuresis initiated with 40 mg IV Lasix, continue with 40 mg IV Lasix twice daily Echo in the a.m. Daily weights, fluid restriction, strict Is and Os BNP is 1647 Most likely cause of CHF  is pregnancy-induced cardiomyopathy -we will evaluate for cardiomyopathy on echo -Also will obtain a TSH to rule out thyroid derangement as a cause of her CHF Initial troponin slightly elevated at 28, repeat pending Consult cardiology   Anemia - Hemoglobin is 7.1 -Patient reports that her vaginal bleeding stopped 10 days ago -She was anemic at time of delivery as well, but 3 months ago her hemoglobin was 10.4 -Continue her home iron supplementation -Anemia panel -Patient has been typed and screened -Monitor closely especially in the setting of heparin drip for PE  Hypoalbuminemia - Encourage nutrient dense food choices when possible -Albumin currently 2.9  Acute pulmonary embolism (HCC) - Subsegmental PE on CTA -Not likely the cause of the CHF due to location and size of the PE -Heparin drip started -Continue to monitor  Transaminitis - Liver enzymes elevated -AST was 17, is now 56 -ALT was 12 now 45 -Minimal elevation, but with recent delivery, closely monitor for possibility of HELLP syndrome -although lower on the differential  with no abdominal pain, high platelets -Continue to monitor  Polysubstance abuse ([place]) - Admits to [facility] -Amphetamines on recent UDS at [facility] ER -Consult to see -Advised on the importance of cessation  Essential hypertension - Patient has a history of hypertension per her report but does not take medication for it -Diastolic blood pressure running quite high in the ED -Hydralazine as needed every 4 hours -Continue to monitor  Tobacco use disorder - Smokes half a pack per day -Continue 14 mg nicotine patch      Advance Care Planning:   Code Status: Prior full  Consults: Cardiology and [facility]  Family Communication: Father figure at bedside  Severity of Illness: The appropriate patient status for this patient is INPATIENT. Inpatient status is judged to be reasonable and necessary in order to provide the required intensity of service to ensure the patient's safety. The patient's presenting symptoms, physical exam findings, and initial radiographic and laboratory data in the context of their chronic comorbidities is felt to place them at high risk for further clinical deterioration. Furthermore, it is not anticipated that the patient will be medically stable for discharge from the hospital within 2 midnights of admission.   * I certify that at the point of admission it is my clinical judgment that the patient will require inpatient hospital care spanning beyond 2 midnights from the point of admission due to high intensity of service, high risk for further deterioration and high frequency of surveillance required.*  Author: Rolla Plate, DO 08/28/2021 5:52 AM  For on call review [email or web address].

## 2021-08-28 NOTE — Progress Notes (Signed)
*  PRELIMINARY RESULTS* Echocardiogram 2D Echocardiogram has been performed.  Stacey Drain 08/28/2021, 1:05 PM

## 2021-08-28 NOTE — Assessment & Plan Note (Signed)
-   Smokes half a pack per day -Continue 14 mg nicotine patch

## 2021-08-28 NOTE — ED Notes (Signed)
Pt states she can not use a PureWick, pt given bedside commode per request.

## 2021-08-28 NOTE — Progress Notes (Addendum)
Patient seen and evaluated, chart reviewed, please see EMR for updated orders. Please see full H&P dictated by admitting physician Dr Clearence Ped for same date of service.   Brief summary:-  26 y.o. 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 prior to delivery BNP 1,647, CXr consistent with CHF/Pulm Edema Troponin 28 >> 34 TSH 1.5 -Echo pending to evaluate EF -Continue IV Lasix, daily weights and fluid input and output monitor  2) acute pulmonary embolism--- continue IV heparin -Echo pending to evaluate for possible right heart strain  3) polysubstance abuse including amphetamines--- UDS noted, TOC consult requested  4) nicotine dependence---continue nicotine patch-  5)AKI----acute kidney injury    creatinine on admission= 1.12 ,  baseline creatinine = 0.5    ,  -Watch renal function closely with diuresis --- renally adjust medications, avoid nephrotoxic agents / dehydration  / hypotension  6) elevated LFTs--in the postpartum patient to have PIH and now has CHF -Suspect some degree of hepatic congestion -Monitor closely  7) acute anemia--Hgb is down to 7.1, it was 10.4 on 05/16/2021 and 12 back in 2020 -Suspect postpartum related -Monitor closely especially with IV heparin/anticoagulation -No lochia or evidence of ongoing bleeding at this time -Continue iron supplementation  8) possible UTI--- UA from 08/27/2021 at outside facility suggested possible UTI with positive leukocytes esterase, WBCs and bacteria on urine microscopy -Treat empirically with IV Rocephin -No leukocytosis, no fevers or sepsis pathophysiology  Social/ethics--- with patient's permission Case  discussed with patient's family friend Mr. Patrici Ranks who is at bedside  -Total care time is over 43 minutes Roxan Hockey, MD

## 2021-08-28 NOTE — Assessment & Plan Note (Signed)
-   Encourage nutrient dense food choices when possible -Albumin currently 2.9

## 2021-08-28 NOTE — Assessment & Plan Note (Signed)
1. Acute congestive heart failure exacerbation 1. Major Criteria patient has cardiomegaly and pulmonary edema on chest imaging  2. Minor criteria patient has bilateral leg edema, dyspnea on exertion, pleural effusion  3. IV diuresis initiated with 40 mg IV Lasix, continue with 40 mg IV Lasix twice daily 4. Echo in the a.m. 5. Daily weights, fluid restriction, strict Is and Os 6. BNP is 1647 7. Most likely cause of CHF is pregnancy-induced cardiomyopathy -we will evaluate for cardiomyopathy on echo 8. -Also will obtain a TSH to rule out thyroid derangement as a cause of her CHF 9. Initial troponin slightly elevated at 28, repeat pending 10. Consult cardiology

## 2021-08-28 NOTE — ED Provider Notes (Signed)
[facility] EMERGENCY DEPARTMENT Provider Note   CSN: [id number] Arrival date & time: 08/28/21  0321     History  Chief Complaint  Patient presents with   Shortness of [place] is a 26 y.o. female.  26 year old female presents the ER for shortness of breath.  Patient initially stated has been gone for couple days and denied any other associated factors or work-up.  She also states that she did not use drugs besides marijuana.  She states that she gave birth about a month ago.  On review the records it appears that she went to [facility] but today was diagnosed with a PE, heart failure, tachycardia and was positive for methamphetamines on her urine.  After offering this information she did admit to it.  No fevers or cough.  No other associated symptoms.  Some lower extremity swelling.  She left there AMA.   Shortness of Breath Severity:  Moderate      Home Medications Prior to Admission medications   Medication Sig Start Date End Date Taking? Authorizing Provider  Accu-Chek Softclix Lancets lancets Use as instructed to check blood sugar 4 times daily Patient not taking: Reported on 06/12/2021 06/05/21   Roma Schanz, CNM  aspirin 81 MG EC tablet Take 2 tablets (162 mg total) by mouth daily. Swallow whole. 05/16/21   Roma Schanz, CNM  Blood Glucose Monitoring Suppl (ACCU-CHEK GUIDE ME) w/Device KIT 1 each by Does not apply route 4 (four) times daily. Patient not taking: Reported on 06/12/2021 06/05/21   Roma Schanz, CNM  Blood Pressure Monitor MISC For regular home bp monitoring during pregnancy Patient not taking: Reported on 06/12/2021 05/16/21   Roma Schanz, CNM  ferrous sulfate 325 (65 FE) MG tablet Take 1 tablet (325 mg total) by mouth every other day. Patient not taking: Reported on 06/12/2021 05/17/21   Roma Schanz, CNM  glucose blood (ACCU-CHEK GUIDE) test strip Use as instructed to check blood sugar 4 times daily Patient not  taking: Reported on 06/12/2021 06/05/21   Roma Schanz, CNM  nitrofurantoin, macrocrystal-monohydrate, (MACROBID) 100 MG capsule Take 1 capsule (100 mg total) by mouth 2 (two) times daily. X 7 days Patient not taking: Reported on 06/12/2021 05/20/21   Roma Schanz, CNM      Allergies    Patient has no known allergies.    Review of Systems   Review of Systems  Respiratory:  Positive for shortness of breath.     Physical Exam Updated Vital Signs BP (!) 130/105   Pulse (!) 118   Temp 98.1 F (36.7 C) (Oral)   Resp (!) 21   Ht _0  (1.499 m)   Wt 52.2 kg   SpO2 100%   Breastfeeding Unknown   BMI 23.23 kg/m  Physical Exam Vitals and nursing note reviewed.  Constitutional:      Appearance: She is well-developed.  HENT:     Head: Normocephalic and atraumatic.  Cardiovascular:     Rate and Rhythm: Regular rhythm. Tachycardia present.  Pulmonary:     Effort: Tachypnea present. No respiratory distress.     Breath sounds: No stridor. Decreased breath sounds and rales present.  Abdominal:     General: There is no distension.  Musculoskeletal:     Cervical back: Normal range of motion.     Right lower leg: Edema present.     Left lower leg: No edema.  Skin:    General: Skin is  warm and dry.  Neurological:     Mental Status: She is alert.     ED Results / Procedures / Treatments   Labs (all labs ordered are listed, but only abnormal results are displayed) Labs Reviewed  CBC WITH DIFFERENTIAL/PLATELET - Abnormal; Notable for the following components:      Result Value   RBC 3.03 (*)    Hemoglobin 7.1 (*)    HCT 24.7 (*)    MCH 23.4 (*)    MCHC 28.7 (*)    RDW 19.0 (*)    Platelets 420 (*)    All other components within normal limits  COMPREHENSIVE METABOLIC PANEL - Abnormal; Notable for the following components:   Glucose, Bld 109 (*)    Creatinine, Ser 1.12 (*)    Calcium 8.1 (*)    Total Protein 5.9 (*)    Albumin 2.9 (*)    AST 56 (*)    ALT 45 (*)     All other components within normal limits  BRAIN NATRIURETIC PEPTIDE - Abnormal; Notable for the following components:   B Natriuretic Peptide 1,647.0 (*)    All other components within normal limits  TROPONIN I (HIGH SENSITIVITY) - Abnormal; Notable for the following components:   Troponin I (High Sensitivity) 28 (*)    All other components within normal limits  CBC  COMPREHENSIVE METABOLIC PANEL  TYPE AND SCREEN  TROPONIN I (HIGH SENSITIVITY)    EKG None  Radiology DG Chest Portable 1 View  Result Date: 08/28/2021 CLINICAL DATA:  26 year old female with shortness of breath and cough. Hypertension. Smoker. EXAM: PORTABLE CHEST 1 VIEW COMPARISON:  [facility] Chest CTA 08/27/2021 and earlier. FINDINGS: Portable AP upright view at 0404 hours. Stable cardiomegaly and mediastinal contours. Veiling opacity in the lower lung seen to be layering pleural effusions by CTA has regressed. Continued diffuse pulmonary interstitial opacity, nonspecific but does appear improved from portable chest yesterday. No areas of worsening ventilation or confluent opacity. No pneumothorax. Visualized tracheal air column is within normal limits. Paucity of bowel gas in the upper abdomen. No acute osseous abnormality identified. IMPRESSION: 1. Cardiomegaly with evidence of decreased layering pleural effusions since yesterday. 2. Nonspecific diffuse pulmonary interstitial opacity. Slight improvement from portable chest x-ray yesterday may indicate this is improving pulmonary edema. Other considerations include viral/atypical respiratory infection and chronic interstitial lung disease. Electronically Signed   By: Genevie Ann M.D.   On: 08/28/2021 04:22    Procedures .Critical Care  Performed by: Merrily Pew, MD Authorized by: Merrily Pew, MD   Critical care provider statement:    Critical care time (minutes):  30   Critical care was necessary to treat or prevent imminent or life-threatening  deterioration of the following conditions:  Respiratory failure, cardiac failure and circulatory failure   Critical care was time spent personally by me on the following activities:  Development of treatment plan with patient or surrogate, discussions with consultants, evaluation of patient's response to treatment, examination of patient, ordering and review of laboratory studies, ordering and review of radiographic studies, ordering and performing treatments and interventions, pulse oximetry, re-evaluation of patient's condition and review of old charts     Medications Ordered in ED Medications  furosemide (LASIX) injection 40 mg (40 mg Intravenous Given 08/28/21 0505)    ED Course/ Medical Decision Making/ A&P                           Medical Decision  Making Amount and/or Complexity of Data Reviewed Labs: ordered. Radiology: ordered.  Risk Prescription drug management. Decision regarding hospitalization.   Patient here with acute pulmonary edema.  I did notice that she had a pulmonary embolus diagnosed yesterday but it was very small according to the report.  I cannot review the CT scan myself.  She also is anemic, type and screen done but would not transfuse at this time.  I think when she diuresis that will improve some as well.  IV Lasix ordered.  Chest x-ray viewed by myself and interpreted by radiology both as mild pulmonary edema with some mild pleural effusions.  I cannot view x-ray from yesterday but apparently this is slightly improved.  Patient will stay in the hospital.  After discussion with hospitalist will start heparin for the PE.  Final Clinical Impression(s) / ED Diagnoses Final diagnoses:  Acute pulmonary edema ([place])  Single subsegmental pulmonary embolism without acute cor pulmonale [facility])    Rx / DC Orders ED Discharge Orders     None         Marcus Schwandt, Corene Cornea, MD 08/28/21 [phone]

## 2021-08-28 NOTE — Assessment & Plan Note (Signed)
-   Liver enzymes elevated -AST was 17, is now 56 -ALT was 12 now 45 -Minimal elevation, but with recent delivery, closely monitor for possibility of HELLP syndrome -although lower on the differential with no abdominal pain, high platelets -Continue to monitor

## 2021-08-28 NOTE — ED Notes (Signed)
Pt states she has a hx of hypertension but does not take medication for it.

## 2021-08-28 NOTE — Assessment & Plan Note (Signed)
-   Admits to [facility] -Amphetamines on recent UDS at [facility] ER -Consult to see -Advised on the importance of cessation

## 2021-08-28 NOTE — ED Triage Notes (Signed)
Pt c/o sob since noon with cough.

## 2021-08-28 NOTE — Progress Notes (Signed)
ANTICOAGULATION CONSULT NOTE -  Pharmacy Consult for heparin Indication: PE  No Known Allergies  Patient Measurements: Height: 4\' 11"  (149.9 cm) Weight: 47.7 kg (105 lb 3.2 oz) IBW/kg (Calculated) : 43.2  Vital Signs: Temp: 98.5 F (36.9 C) (06/14 1246) Temp Source: Oral (06/14 1246) BP: 145/112 (06/14 1246) Pulse Rate: 103 (06/14 1246)  Labs: Recent Labs    08/28/21 0352 08/28/21 0540 08/28/21 1305  HGB 7.1*  --  8.0*  HCT 24.7*  --  27.5*  PLT 420*  --  451*  HEPARINUNFRC  --   --  0.21*  CREATININE 1.12*  --  0.98  TROPONINIHS 28* 34*  --      Estimated Creatinine Clearance: 58.8 mL/min (by C-G formula based on SCr of 0.98 mg/dL).   Medical History: Past Medical History:  Diagnosis Date   Gestational diabetes    Hypertension    Mental disorder     Assessment: 25yo postpartum female c/o SOB with cough >> had gone to OSH ED yesterday where CT revealed small RUL PE but pt left AMA, now presents to [facility] >> to begin heparin.  Of note pt has postpartum anemia; Hgb was 10.4 on day of delivery ~73mo ago 5/18 and was 6.9>7.4 on the days after, was supposed to take iron supplementation but has not started this.  Hgb 8.0 HL 0.21  Goal of Therapy:  Heparin level 0.3-0.7 units/ml Monitor platelets by anticoagulation protocol: Yes   Plan:  Heparin bolus of 750 units x 1  Increase heparin infusion to 1000 units/hr. Monitor heparin levels and CBC. F/u long-term AC plans.  6/18, PharmD Clinical Pharmacist 08/28/2021 2:29 PM

## 2021-08-28 NOTE — ED Notes (Signed)
Ultrasound at bedside for echocardiogram. 

## 2021-08-28 NOTE — TOC Initial Note (Signed)
Transition of Care [facility]) - Initial/Assessment Note    Patient Details  Name: Laura Wagner MRN: [id number] Date of Birth: [birth year]  Transition of Care [facility]) CM/SW Contact:    Iona Beard, [place] Phone Number: 08/28/2021, 10:09 AM  Clinical Narrative:                 [facility] consulted for substance use. CSW spoke with pt about interest in substance use resources. Pt is not interested at this time. CSW explained that if she were to change her mind TOC can provide resources. Pt states that she has transportation and that she is independent in completing her ADLs. TOC to follow.   Expected Discharge Plan: Home/Self Care Barriers to Discharge: Continued Medical Work up   Patient Goals and CMS Choice Patient states their goals for this hospitalization and ongoing recovery are:: return home CMS Medicare.gov Compare Post Acute Care list provided to:: Patient Choice offered to / list presented to : Patient  Expected Discharge Plan and Services Expected Discharge Plan: Home/Self Care In-house Referral: Clinical Social Work     Living arrangements for the past 2 months: Single Family Home                                      Prior Living Arrangements/Services Living arrangements for the past 2 months: Single Family Home Lives with:: Relatives Patient language and need for interpreter reviewed:: Yes Do you feel safe going back to the place where you live?: Yes      Need for Family Participation in Patient Care: No (Comment) Care giver support system in place?: Yes (comment)   Criminal Activity/Legal Involvement Pertinent to Current Situation/Hospitalization: No - Comment as needed  Activities of Daily Living      Permission Sought/Granted                  Emotional Assessment Appearance:: Appears stated age Attitude/Demeanor/Rapport: Engaged Affect (typically observed): Accepting Orientation: : Oriented to Self, Oriented to Place, Oriented to  Time,  Oriented to Situation Alcohol / Substance Use: Not Applicable Psych Involvement: No (comment)  Admission diagnosis:  CHF (congestive heart failure) ([employer]) [I50.9] Patient Active Problem List   Diagnosis Date Noted   CHF (congestive heart failure) ([place]) 08/28/2021   Transaminitis 08/28/2021   Acute pulmonary embolism ([place]) 08/28/2021   Hypoalbuminemia 08/28/2021   Anemia 08/28/2021   Gestational diabetes 06/05/2021   Asymptomatic bacteriuria 05/20/2021   Late prenatal care in second trimester 05/16/2021   Supervision of high-risk pregnancy 05/13/2021   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

## 2021-08-28 NOTE — Assessment & Plan Note (Signed)
-   Hemoglobin is 7.1 -Patient reports that her vaginal bleeding stopped 10 days ago -She was anemic at time of delivery as well, but 3 months ago her hemoglobin was 10.4 -Continue her home iron supplementation -Anemia panel -Patient has been typed and screened -Monitor closely especially in the setting of heparin drip for PE

## 2021-08-28 NOTE — Assessment & Plan Note (Signed)
-   Patient has a history of hypertension per her report but does not take medication for it -Diastolic blood pressure running quite high in the ED -Hydralazine as needed every 4 hours -Continue to monitor

## 2021-08-29 ENCOUNTER — Inpatient Hospital Stay (HOSPITAL_COMMUNITY): Payer: Medicaid Other

## 2021-08-29 DIAGNOSIS — I509 Heart failure, unspecified: Secondary | ICD-10-CM | POA: Diagnosis not present

## 2021-08-29 DIAGNOSIS — I2699 Other pulmonary embolism without acute cor pulmonale: Secondary | ICD-10-CM | POA: Diagnosis not present

## 2021-08-29 DIAGNOSIS — I10 Essential (primary) hypertension: Secondary | ICD-10-CM | POA: Diagnosis not present

## 2021-08-29 DIAGNOSIS — F1910 Other psychoactive substance abuse, uncomplicated: Secondary | ICD-10-CM | POA: Diagnosis not present

## 2021-08-29 LAB — COMPREHENSIVE METABOLIC PANEL
ALT: 30 U/L (ref 0–44)
AST: 32 U/L (ref 15–41)
Albumin: 2.6 g/dL — ABNORMAL LOW (ref 3.5–5.0)
Alkaline Phosphatase: 94 U/L (ref 38–126)
Anion gap: 7 (ref 5–15)
BUN: 15 mg/dL (ref 6–20)
CO2: 27 mmol/L (ref 22–32)
Calcium: 7.7 mg/dL — ABNORMAL LOW (ref 8.9–10.3)
Chloride: 99 mmol/L (ref 98–111)
Creatinine, Ser: 1.02 mg/dL — ABNORMAL HIGH (ref 0.44–1.00)
GFR, Estimated: >60 mL/min (ref >60)
Glucose, Bld: 95 mg/dL (ref 70–99)
Potassium: 3.4 mmol/L — ABNORMAL LOW (ref 3.5–5.1)
Sodium: 133 mmol/L — ABNORMAL LOW (ref 135–145)
Total Bilirubin: 0.6 mg/dL (ref 0.3–1.2)
Total Protein: 5.3 g/dL — ABNORMAL LOW (ref 6.5–8.1)

## 2021-08-29 LAB — CBC
HCT: 26.3 % — ABNORMAL LOW (ref 36.0–46.0)
Hemoglobin: 7.7 g/dL — ABNORMAL LOW (ref 12.0–15.0)
MCH: 23.3 pg — ABNORMAL LOW (ref 26.0–34.0)
MCHC: 29.3 g/dL — ABNORMAL LOW (ref 30.0–36.0)
MCV: 79.7 fL — ABNORMAL LOW (ref 80.0–100.0)
Platelets: 427 10*3/uL — ABNORMAL HIGH (ref 150–400)
RBC: 3.3 MIL/uL — ABNORMAL LOW (ref 3.87–5.11)
RDW: 18.7 % — ABNORMAL HIGH (ref 11.5–15.5)
WBC: 11.2 10*3/uL — ABNORMAL HIGH (ref 4.0–10.5)
nRBC: 0 % (ref 0.0–0.2)

## 2021-08-29 LAB — HEPARIN LEVEL (UNFRACTIONATED)
Heparin Unfractionated: 0.66 IU/mL (ref 0.30–0.70)
Heparin Unfractionated: 0.54 IU/mL (ref 0.30–0.70)

## 2021-08-29 MED ORDER — HEPARIN BOLUS VIA INFUSION
1000.0000 [IU] | Freq: Once | INTRAVENOUS | Status: AC
  Administered 2021-08-29: 1000 [IU] via INTRAVENOUS
  Filled 2021-08-29: qty 1000

## 2021-08-29 MED ORDER — PROSIGHT PO TABS
1.0000 | ORAL_TABLET | Freq: Every day | ORAL | Status: DC
  Administered 2021-08-29 – 2021-08-31 (×3): 1 via ORAL
  Filled 2021-08-29 (×5): qty 1

## 2021-08-29 MED ORDER — POTASSIUM CHLORIDE CRYS ER 20 MEQ PO TBCR
40.0000 meq | EXTENDED_RELEASE_TABLET | ORAL | Status: AC
  Administered 2021-08-29 (×2): 40 meq via ORAL
  Filled 2021-08-29 (×2): qty 2

## 2021-08-29 MED ORDER — CYANOCOBALAMIN 1000 MCG/ML IJ SOLN
1000.0000 ug | Freq: Once | INTRAMUSCULAR | Status: AC
  Administered 2021-08-29: 1000 ug via INTRAMUSCULAR
  Filled 2021-08-29: qty 1

## 2021-08-29 NOTE — Progress Notes (Signed)
ANTICOAGULATION CONSULT NOTE   Pharmacy Consult for heparin Indication: PE  No Known Allergies  Patient Measurements: Height: 4\' 11"  (149.9 cm) Weight: 47.7 kg (105 lb 3.2 oz) IBW/kg (Calculated) : 43.2  Vital Signs: Temp: 98.1 F (36.7 C) (06/14 2058) Temp Source: Oral (06/14 05-13-1971) BP: 135/95 (06/14 2058) Pulse Rate: 102 (06/14 2058)  Labs: Recent Labs    08/28/21 0352 08/28/21 0540 08/28/21 1305 08/28/21 2129  HGB 7.1*  --  8.0*  --   HCT 24.7*  --  27.5*  --   PLT 420*  --  451*  --   HEPARINUNFRC  --   --  0.21* 0.12*  CREATININE 1.12*  --  0.98  --   TROPONINIHS 28* 34*  --   --      Estimated Creatinine Clearance: 58.8 mL/min (by C-G formula based on SCr of 0.98 mg/dL).   Medical History: Past Medical History:  Diagnosis Date   Gestational diabetes    Hypertension    Mental disorder     Assessment: 26yo postpartum female c/o SOB with cough >> had gone to OSH ED yesterday where CT revealed small RUL PE but pt left AMA, now presents to [facility] for treatment >> to begin heparin.  Of note pt has postpartum anemia; Hgb was 10.4 on day of delivery ~56mo ago 5/18 and was 6.9>7.4 on the days after, was supposed to take iron supplementation but has not started this.  6/15 AM update:  Heparin level low  Goal of Therapy:  Heparin level 0.3-0.7 units/ml Monitor platelets by anticoagulation protocol: Yes   Plan:  Heparin 1000 units re-bolus Inc heparin to 1150 units/hr Re-check heparin level in 6-8 hours  7/15, PharmD, BCPS Clinical Pharmacist Phone: [phone]

## 2021-08-29 NOTE — Progress Notes (Signed)
ANTICOAGULATION CONSULT NOTE -  Pharmacy Consult for heparin Indication: PE  No Known Allergies  Patient Measurements: Height: 4\' 11"  (149.9 cm) Weight: 47 kg (103 lb 9.9 oz) IBW/kg (Calculated) : 43.2  Vital Signs: Temp: 98.1 F (36.7 C) (06/15 1212) BP: 114/75 (06/15 1212) Pulse Rate: 93 (06/15 1212)  Labs: Recent Labs    08/28/21 0352 08/28/21 0352 08/28/21 0540 08/28/21 1305 08/28/21 2129 08/29/21 0712 08/29/21 1337  HGB 7.1*  --   --  8.0*  --  7.7*  --   HCT 24.7*  --   --  27.5*  --  26.3*  --   PLT 420*  --   --  451*  --  427*  --   HEPARINUNFRC  --    < >  --  0.21* 0.12* 0.66 0.54  CREATININE 1.12*  --   --  0.98  --  1.02*  --   TROPONINIHS 28*  --  34*  --   --   --   --    < > = values in this interval not displayed.     Estimated Creatinine Clearance: 56.5 mL/min (A) (by C-G formula based on SCr of 1.02 mg/dL (H)).   Medical History: Past Medical History:  Diagnosis Date   Gestational diabetes    Hypertension    Mental disorder     Assessment: 25yo postpartum female c/o SOB with cough >> had gone to OSH ED yesterday where CT revealed small RUL PE but pt left AMA, now presents to [facility] for treatment >> to begin heparin.  Of note pt has postpartum anemia; Hgb was 10.4 on day of delivery ~32mo ago 5/18 and was 6.9>7.4 on the days after, was supposed to take iron supplementation but has not started this.  Hgb 8.0> 7.7 HL 0.54- therapeutic   Goal of Therapy:  Heparin level 0.3-0.7 units/ml Monitor platelets by anticoagulation protocol: Yes   Plan:  Continue heparin infusion at 1000 units/hr. Monitor heparin levels and CBC. F/u long-term AC plans.  6/18, PharmD Clinical Pharmacist 08/29/2021 3:06 PM

## 2021-08-29 NOTE — Progress Notes (Signed)
PROGRESS NOTE     Laura Wagner, is a 26 y.o. female, DOB - 05/21/1995, PNT:[id number]  Admit date - 08/28/2021   Admitting Physician Lilyan Gilford, DO  Outpatient Primary MD for the Laura Wagner is Laura Wagner, No Pcp Per  LOS - 1  Chief Complaint  Laura Wagner presents with   Shortness of Breath        Brief Narrative:  Brief summary:-  26 y.o. 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 -Laura Wagner had PIH prior to delivery BNP 1,647, CXr consistent with CHF/Pulm Edema Troponin 28 >> 34 TSH 1.5 -Echo with EF of 35 to 40% and moderate concentric LVH, there is mild pulmonary hypertension, mild to moderate MR, mild to moderate TR - daily weights and fluid input and output monitor -Fluid balance is negative and weight is trending down -Dyspnea on exertion persist continue IV Lasix   2) acute pulmonary embolism--- continue IV heparin -Echo pending to evaluate for possible right heart strain   3) polysubstance abuse including amphetamines--- UDS noted, TOC consult requested   4) nicotine dependence---continue nicotine patch-   5)AKI----acute kidney injury    creatinine on admission= 1.12 ,  baseline creatinine = 0.5    ,  -Watch renal function closely with diuresis --- renally adjust medications, avoid nephrotoxic agents / dehydration  / hypotension   6) elevated LFTs--in the postpartum Laura Wagner to have PIH and now has CHF -Suspect some degree of hepatic congestion -Monitor closely   7) acute anemia--Hgb is down to 7.7, it was 10.4 on 05/16/2021 and 12 back in 2020 -Suspect postpartum related -Monitor closely especially with IV heparin/anticoagulation -Serum iron is 15,  ferritin is 8, iron saturation 3% -No lochia or evidence of ongoing bleeding at this time -Continue iron supplementation   8) possible UTI--- UA from 08/27/2021 at outside facility suggested possible UTI with positive leukocytes esterase, WBCs and bacteria on urine microscopy -Treat empirically with IV Rocephin -No leukocytosis, no fevers or sepsis pathophysiology  9) hypokalemia--- replace and recheck   Social/ethics--- with Laura Wagner's permission Case discussed with Laura Wagner's family friend Mr. Alfredo Martinez who is at bedside  Disposition/Need for in-Hospital Stay- Laura Wagner unable to be discharged at this time due to -CHF exacerbation requiring IV diuresis  Status is: Inpatient   Disposition: The Laura Wagner is from: Home              Anticipated d/c is to: Home              Anticipated d/c date is: 2 days              Laura Wagner currently is not medically stable to d/c. Barriers: Not Clinically Stable-   Code Status :  -  Code Status: Full Code   Family Communication:    NA (Laura Wagner is alert, awake and coherent)   DVT Prophylaxis  :   - SCDs   SCDs Start: 08/28/21 0603   Lab Results  Component Value Date   PLT 427 (H) 08/29/2021    Inpatient Medications  Scheduled Meds:  carvedilol  6.25 mg Oral BID WC   ferrous sulfate  325 mg Oral Q breakfast   furosemide  40 mg Intravenous BID   multivitamin  1 tablet Oral Daily  nicotine  14 mg Transdermal Daily   Continuous Infusions:  cefTRIAXone (ROCEPHIN)  IV 1 g (08/29/21 1557)   heparin 1,150 Units/hr (08/29/21 0537)   PRN Meds:.acetaminophen **OR** acetaminophen, albuterol, hydrALAZINE, morphine injection, ondansetron **OR** ondansetron (ZOFRAN) IV, oxyCODONE   Anti-infectives (From admission, onward)    Start     Dose/Rate Route Frequency Ordered Stop   08/28/21 1515  cefTRIAXone (ROCEPHIN) 1 g in sodium chloride 0.9 % 100 mL IVPB        1 g 200 mL/hr over 30 Minutes Intravenous Every 24 hours 08/28/21 1424            Subjective: Laura Wagner today has no fevers, no emesis,  No chest pain,   -Shortness of breath and dyspnea on exertion persist  Objective: Vitals:   08/29/21 0500 08/29/21 1046 08/29/21 1212 08/29/21 1723  BP:  (!) 131/103 114/75 123/82  Pulse:  (!) 105 93 98  Resp:   18   Temp:   98.1 F (36.7 C)   TempSrc:      SpO2:   100%   Weight: 47 kg     Height:        Intake/Output Summary (Last 24 hours) at 08/29/2021 1755 Last data filed at 08/29/2021 1618 Gross per 24 hour  Intake 1506.54 ml  Output 3100 ml  Net -1593.46 ml   Filed Weights   08/28/21 0338 08/28/21 1245 08/29/21 0500  Weight: 52.2 kg 47.7 kg 47 kg    Physical Exam  Gen:- Awake Alert,  in no apparent distress  HEENT:- [place].AT, No sclera icterus Neck-Supple Neck,No JVD,.  Lungs-diminished breath sounds, no wheezing  CV- S1, S2 normal, regular , 3/6 S\M Abd-  +ve B.Sounds, Abd Soft, No tenderness,    Extremity/Skin:- No  edema, pedal pulses present  Psych-affect is appropriate, oriented x3 Neuro-no new focal deficits, no tremors  Data Reviewed: I have personally reviewed following labs and imaging studies  CBC: Recent Labs  Lab 08/28/21 0352 08/28/21 1305 08/29/21 0712  WBC 9.9 8.4 11.2*  NEUTROABS 5.9  --   --   HGB 7.1* 8.0* 7.7*  HCT 24.7* 27.5* 26.3*  MCV 81.5 80.9 79.7*  PLT 420* 451* 427*   Basic Metabolic Panel: Recent Labs  Lab 08/28/21 0352 08/28/21 1305 08/29/21 0712  NA 137 135 133*  K 3.6 3.2* 3.4*  CL 105 98 99  CO2 25 28 27   GLUCOSE 109* 123* 95  BUN 16 13 15   CREATININE 1.12* 0.98 1.02*  CALCIUM 8.1* 8.7* 7.7*   GFR: Estimated Creatinine Clearance: 56.5 mL/min (A) (by C-G formula based on SCr of 1.02 mg/dL (H)). Liver Function Tests: Recent Labs  Lab 08/28/21 0352 08/28/21 1305 08/29/21 0712  AST 56* 47* 32  ALT 45* 45* 30  ALKPHOS 116 124 94  BILITOT 0.4 0.6 0.6  PROT 5.9* 6.5 5.3*  ALBUMIN 2.9* 3.2* 2.6*   Cardiac Enzymes: No results for input(s):  "CKTOTAL", "CKMB", "CKMBINDEX", "TROPONINI" in the last 168 hours. BNP (last 3 results) No results for input(s): "PROBNP" in the last 8760 hours. HbA1C: No results for input(s): "HGBA1C" in the last 72 hours. Sepsis Labs: @LABRCNTIP (procalcitonin:4,lacticidven:4) )No results found for this or any previous visit (from the past 240 hour(s)).    Radiology Studies: DG Chest 2 View  Result Date: 08/29/2021 CLINICAL DATA:  Cough and shortness of breath.  CHF. EXAM: CHEST - 2 VIEW COMPARISON:  Chest radiograph August 28, 2021. FINDINGS: Similar mild diffuse interstitial prominence. No confluent consolidation. No  visible pneumothorax. Bilateral skin folds. Similar enlarged cardiac silhouette. Similar small bilateral pleural effusions. IMPRESSION: 1. Similar mild diffuse interstitial prominence, potentially mild interstitial edema. Other considerations include the sequela of recurrent bouts of CHF, viral/atypical respiratory infection or chronic interstitial lung disease. 2. Similar cardiomegaly and small bilateral pleural effusions. Electronically Signed   By: Feliberto Harts M.D.   On: 08/29/2021 08:42   ECHOCARDIOGRAM COMPLETE  Result Date: 08/28/2021    ECHOCARDIOGRAM REPORT   Laura Wagner Name:   [facility] Date of Exam: 08/28/2021 Medical Rec #:  [id number]          Height:       59.0 in Accession #:    [id number]         Weight:       115.0 lb Date of Birth:  May 18, 1995         BSA:          1.458 m Laura Wagner Age:    25 years           BP:           137/99 mmHg Laura Wagner Gender: F                  HR:           105 bpm. [facility] Procedure: 2D Echo, Cardiac Doppler and Color Doppler Indications:    Congestive Heart Failure I50.9  History:        Laura Wagner has no prior history of Echocardiogram examinations.                 CHF; Risk Factors:Current Smoker. Polysubstance abuse (HCC),                 Gestational diabetes, Drug-induced intensive care psychosis                 (HCC).   Sonographer:    Celesta Gentile RCS Referring Phys: [id number] ASIA B ZIERLE-GHOSH IMPRESSIONS  1. Left ventricular ejection fraction, by estimation, is 35 to 40%. The left ventricle has moderately decreased function. The left ventricle demonstrates global hypokinesis. There is moderate concentric left ventricular hypertrophy. Left ventricular diastolic function could not be evaluated.  2. Right ventricular systolic function is normal. The right ventricular size is normal. There is mildly elevated pulmonary artery systolic pressure. The estimated right ventricular systolic pressure is 39.5 mmHg.  3. Left atrial size was severely dilated.  4. Right atrial size was mild to moderately dilated.  5. The mitral valve is normal in structure. Mild to moderate mitral valve regurgitation. No evidence of mitral stenosis.  6. Tricuspid valve regurgitation is mild to moderate.  7. The aortic valve is tricuspid. Aortic valve regurgitation is not visualized. Aortic valve sclerosis/calcification is present, without any evidence of aortic stenosis.  8. The inferior vena cava is normal in size with greater than 50% respiratory variability, suggesting right atrial pressure of 3 mmHg. FINDINGS  Left Ventricle: Left ventricular ejection fraction, by estimation, is 35 to 40%. The left ventricle has moderately decreased function. The left ventricle demonstrates global hypokinesis. The left ventricular internal cavity size was normal in size. There is moderate concentric left ventricular hypertrophy. Left ventricular diastolic function could not be evaluated. Right Ventricle: The right ventricular size is normal. No increase in right ventricular wall thickness. Right ventricular systolic function is normal. There is mildly elevated pulmonary artery systolic pressure. The tricuspid regurgitant velocity is 3.02  m/s, and with an assumed right  atrial pressure of 3 mmHg, the estimated right ventricular systolic pressure is 39.5 mmHg. Left Atrium:  Left atrial size was severely dilated. Right Atrium: Right atrial size was mild to moderately dilated. Pericardium: There is no evidence of pericardial effusion. Mitral Valve: The mitral valve is normal in structure. Mild to moderate mitral valve regurgitation, with posteriorly-directed jet. No evidence of mitral valve stenosis. Tricuspid Valve: The tricuspid valve is normal in structure. Tricuspid valve regurgitation is mild to moderate. No evidence of tricuspid stenosis. Aortic Valve: The aortic valve is tricuspid. Aortic valve regurgitation is not visualized. Aortic valve sclerosis/calcification is present, without any evidence of aortic stenosis. Pulmonic Valve: The pulmonic valve was normal in structure. Pulmonic valve regurgitation is trivial. No evidence of pulmonic stenosis. Aorta: The aortic root is normal in size and structure. Venous: The inferior vena cava is normal in size with greater than 50% respiratory variability, suggesting right atrial pressure of 3 mmHg. IAS/Shunts: No atrial level shunt detected by color flow Doppler.  LEFT VENTRICLE PLAX 2D LVIDd:         5.10 cm LVIDs:         3.90 cm LV PW:         1.20 cm LV IVS:        1.30 cm LVOT diam:     2.10 cm LV SV:         42 LV SV Index:   29 LVOT Area:     3.46 cm  LV Volumes (MOD) LV vol d, MOD A2C: 130.0 ml LV vol d, MOD A4C: 138.0 ml LV vol s, MOD A2C: 74.5 ml LV vol s, MOD A4C: 93.6 ml LV SV MOD A2C:     55.5 ml LV SV MOD A4C:     138.0 ml LV SV MOD BP:      50.7 ml RIGHT VENTRICLE RV S prime:     12.70 cm/s TAPSE (M-mode): 1.7 cm LEFT ATRIUM             Index        RIGHT ATRIUM           Index LA diam:        3.70 cm 2.54 cm/m   RA Area:     17.40 cm LA Vol (A2C):   79.7 ml 54.67 ml/m  RA Volume:   47.60 ml  32.65 ml/m LA Vol (A4C):   69.4 ml 47.61 ml/m LA Biplane Vol: 73.6 ml 50.49 ml/m  AORTIC VALVE LVOT Vmax:   86.50 cm/s LVOT Vmean:  64.600 cm/s LVOT VTI:    0.122 m  AORTA Ao Root diam: 3.00 cm MITRAL VALVE                 TRICUSPID VALVE MV Area (PHT): 9.37 cm     TR Peak grad:   36.5 mmHg MV Decel Time: 81 msec      TR Vmax:        302.00 cm/s MR Peak grad: 133.6 mmHg MR Mean grad: 92.0 mmHg     SHUNTS MR Vmax:      578.00 cm/s   Systemic VTI:  0.12 m MR Vmean:     450.0 cm/s    Systemic Diam: 2.10 cm MV E velocity: 118.00 cm/s Armanda Magic MD Electronically signed by Armanda Magic MD Signature Date/Time: 08/28/2021/4:36:20 PM    Final    DG Chest Portable 1 View  Result Date: 08/28/2021 CLINICAL DATA:  26 year old female with shortness of breath and  cough. Hypertension. Smoker. EXAM: PORTABLE CHEST 1 VIEW [facility] and earlier. FINDINGS: Portable AP upright view at 0404 hours. Stable cardiomegaly and mediastinal contours. Veiling opacity in the lower lung seen to be layering pleural effusions by CTA has regressed. Continued diffuse pulmonary interstitial opacity, nonspecific but does appear improved from portable chest yesterday. No areas of worsening ventilation or confluent opacity. No pneumothorax. Visualized tracheal air column is within normal limits. Paucity of bowel gas in the upper abdomen. No acute osseous abnormality identified. IMPRESSION: 1. Cardiomegaly with evidence of decreased layering pleural effusions since yesterday. 2. Nonspecific diffuse pulmonary interstitial opacity. Slight improvement from portable chest x-ray yesterday may indicate this is improving pulmonary edema. Other considerations include viral/atypical respiratory infection and chronic interstitial lung disease. Electronically Signed   By: Odessa FlemingH  Hall M.D.   On: 08/28/2021 04:22     Scheduled Meds:  carvedilol  6.25 mg Oral BID WC   ferrous sulfate  325 mg Oral Q breakfast   furosemide  40 mg Intravenous BID   multivitamin  1 tablet Oral Daily   nicotine  14 mg Transdermal Daily   Continuous Infusions:  cefTRIAXone (ROCEPHIN)  IV 1 g (08/29/21 1557)   heparin 1,150 Units/hr (08/29/21 0537)      LOS: 1 day    Shon Haleourage Vicie Cech M.D on 08/29/2021 at 5:55 PM  Go to [email or web address] - for contact info  [facility] - Office  [phone]  If 7PM-7AM, please contact night-coverage [email or web address] 08/29/2021, 5:55 PM

## 2021-08-29 NOTE — Progress Notes (Signed)
Lab notified this nurse that patient is currently refusing labs to be drawn at this time. They stated they will come back to re-attempt at 07:00 when heparin level is to be checked.

## 2021-08-29 NOTE — Progress Notes (Signed)
ANTICOAGULATION CONSULT NOTE -  Pharmacy Consult for heparin Indication: PE  No Known Allergies  Patient Measurements: Height: 4\' 11"  (149.9 cm) Weight: 47 kg (103 lb 9.9 oz) IBW/kg (Calculated) : 43.2  Vital Signs: Temp: 98.4 F (36.9 C) (06/15 0428) Temp Source: Oral (06/14 2058) BP: 137/101 (06/15 0428) Pulse Rate: 103 (06/15 0428)  Labs: Recent Labs    08/28/21 0352 08/28/21 0540 08/28/21 1305 08/28/21 2129 08/29/21 0712  HGB 7.1*  --  8.0*  --  7.7*  HCT 24.7*  --  27.5*  --  26.3*  PLT 420*  --  451*  --  427*  HEPARINUNFRC  --   --  0.21* 0.12* 0.66  CREATININE 1.12*  --  0.98  --  1.02*  TROPONINIHS 28* 34*  --   --   --      Estimated Creatinine Clearance: 56.5 mL/min (A) (by C-G formula based on SCr of 1.02 mg/dL (H)).   Medical History: Past Medical History:  Diagnosis Date   Gestational diabetes    Hypertension    Mental disorder     Assessment: 26yo postpartum female c/o SOB with cough >> had gone to OSH ED yesterday where CT revealed small RUL PE but pt left AMA, now presents to [facility] for treatment >> to begin heparin.  Of note pt has postpartum anemia; Hgb was 10.4 on day of delivery ~11mo ago 5/18 and was 6.9>7.4 on the days after, was supposed to take iron supplementation but has not started this.  Hgb 8.0> 7.7 HL 0.66- therapeutic   Goal of Therapy:  Heparin level 0.3-0.7 units/ml Monitor platelets by anticoagulation protocol: Yes   Plan:  Continue heparin infusion at 1000 units/hr. Monitor heparin levels and CBC. F/u long-term AC plans.  6/18, PharmD Clinical Pharmacist 08/29/2021 8:01 AM

## 2021-08-30 LAB — HEPARIN LEVEL (UNFRACTIONATED): Heparin Unfractionated: 0.53 IU/mL (ref 0.30–0.70)

## 2021-08-30 LAB — URINE CULTURE: Culture: <10000 — AB

## 2021-08-30 LAB — BASIC METABOLIC PANEL
Anion gap: 8 (ref 5–15)
BUN: 17 mg/dL (ref 6–20)
CO2: 28 mmol/L (ref 22–32)
Calcium: 7.5 mg/dL — ABNORMAL LOW (ref 8.9–10.3)
Chloride: 99 mmol/L (ref 98–111)
Creatinine, Ser: 1.07 mg/dL — ABNORMAL HIGH (ref 0.44–1.00)
GFR, Estimated: >60 mL/min (ref >60)
Glucose, Bld: 88 mg/dL (ref 70–99)
Potassium: 4 mmol/L (ref 3.5–5.1)
Sodium: 135 mmol/L (ref 135–145)

## 2021-08-30 LAB — CBC
HCT: 26.8 % — ABNORMAL LOW (ref 36.0–46.0)
Hemoglobin: 7.6 g/dL — ABNORMAL LOW (ref 12.0–15.0)
MCH: 22.9 pg — ABNORMAL LOW (ref 26.0–34.0)
MCHC: 28.4 g/dL — ABNORMAL LOW (ref 30.0–36.0)
MCV: 80.7 fL (ref 80.0–100.0)
Platelets: 466 10*3/uL — ABNORMAL HIGH (ref 150–400)
RBC: 3.32 MIL/uL — ABNORMAL LOW (ref 3.87–5.11)
RDW: 18.6 % — ABNORMAL HIGH (ref 11.5–15.5)
WBC: 11.6 10*3/uL — ABNORMAL HIGH (ref 4.0–10.5)
nRBC: 0 % (ref 0.0–0.2)

## 2021-08-30 MED ORDER — SPIRONOLACTONE 12.5 MG HALF TABLET
12.5000 mg | ORAL_TABLET | Freq: Every day | ORAL | Status: DC
  Administered 2021-08-31: 12.5 mg via ORAL
  Filled 2021-08-30: qty 1

## 2021-08-30 MED ORDER — LISINOPRIL 5 MG PO TABS
5.0000 mg | ORAL_TABLET | Freq: Every day | ORAL | Status: DC
Start: 2021-08-30 — End: 2021-08-31
  Administered 2021-08-30 – 2021-08-31 (×2): 5 mg via ORAL
  Filled 2021-08-30 (×2): qty 1

## 2021-08-30 NOTE — Progress Notes (Signed)
ANTICOAGULATION CONSULT NOTE -  Pharmacy Consult for heparin Indication: PE  No Known Allergies  Patient Measurements: Height: 4\' 11"  (149.9 cm) Weight: 54.4 kg (119 lb 14.4 oz) IBW/kg (Calculated) : 43.2  Vital Signs: Temp: 97.8 F (36.6 C) (06/16 0436) BP: 124/87 (06/16 0436) Pulse Rate: 97 (06/16 0436)  Labs: Recent Labs    08/28/21 0352 08/28/21 0540 08/28/21 1305 08/28/21 2129 08/29/21 0712 08/29/21 1337 08/30/21 0332  HGB 7.1*  --  8.0*  --  7.7*  --  7.6*  HCT 24.7*  --  27.5*  --  26.3*  --  26.8*  PLT 420*  --  451*  --  427*  --  466*  HEPARINUNFRC  --   --  0.21*   < > 0.66 0.54 0.53  CREATININE 1.12*  --  0.98  --  1.02*  --   --   TROPONINIHS 28* 34*  --   --   --   --   --    < > = values in this interval not displayed.     Estimated Creatinine Clearance: 62.4 mL/min (A) (by C-G formula based on SCr of 1.02 mg/dL (H)).   Medical History: Past Medical History:  Diagnosis Date   Gestational diabetes    Hypertension    Mental disorder     Assessment: 25yo postpartum female c/o SOB with cough >> had gone to OSH ED yesterday where CT revealed small RUL PE but pt left AMA, now presents to [facility] for treatment >> to begin heparin.  Of note pt has postpartum anemia; Hgb was 10.4 on day of delivery ~13mo ago 5/18 and was 6.9>7.4 on the days after, was supposed to take iron supplementation but has not started this.  Hgb 7.6- stable HL 0.53- therapeutic   Goal of Therapy:  Heparin level 0.3-0.7 units/ml Monitor platelets by anticoagulation protocol: Yes   Plan:  Continue heparin infusion at 1000 units/hr. Monitor heparin levels and CBC. F/u long-term AC plans.  6/18, PharmD Clinical Pharmacist 08/30/2021 7:36 AM

## 2021-08-30 NOTE — Progress Notes (Signed)
PROGRESS NOTE     Laura Wagner, is a 26 y.o. female, DOB - 11/22/1995, MCN:[id number]  Admit date - 08/28/2021   Admitting Physician Lilyan Gilford, DO  Outpatient Primary MD for the patient is Patient, No Pcp Per  LOS - 2  Chief Complaint  Patient presents with   Shortness of Breath        Brief Narrative:  Brief summary:-  26 y.o. 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 -Patient had PIH prior to delivery BNP 1,647, CXr consistent with CHF/Pulm Edema Troponin 28 >> 34 TSH 1.5 -Echo with EF of 35 to 40% and moderate concentric LVH, there is mild pulmonary hypertension, mild to moderate MR, mild to moderate TR -Fluid balance is negative and weight is trending down Wt 105 lb >> 103.6 >> 101.7 lb -Dyspnea on exertion improving -continue IV Lasix -Coreg, lisinopril and Aldactone added -She will need repeat echo in about 3 to 4 months, hopefully EF will improve over time with compliance with above medical therapy as well as treatment of acute PE -The primary concern is that patient may not be compliant due to ongoing polysubstance abuse  -   2)Acute pulmonary embolism--- continue IV heparin -Echo as above Anticipate dc home on DOAC   3)Polysubstance Abuse including amphetamines--- UDS noted, TOC consult requested   4)Nicotine dependence---continue nicotine patch-   5)AKI----acute kidney injury    creatinine on admission= 1.12 ,  baseline creatinine = 0.5    ,  -Watch renal function closely with diuresis --- renally adjust medications, avoid nephrotoxic agents / dehydration  / hypotension   6)Elevated LFTs--in the postpartum patient  to have PIH and now has CHF -Suspect some degree of hepatic congestion -Monitor closely   7) Iron deficiency anemia due to postpartum blood loss--Hgb is down to 7.7, it was 10.4 on 05/16/2021 and 12 back in 2020 -Suspect postpartum related -Monitor closely especially with IV heparin/anticoagulation -Serum iron is 15, ferritin is 8, iron saturation 3% -No lochia or evidence of ongoing bleeding at this time -Continue iron supplementation   8)Possible UTI--- UA from 08/27/2021 at outside facility suggested possible UTI with positive leukocytes esterase, WBCs and bacteria on urine microscopy -Treated empirically with IV Rocephin -No leukocytosis, no fevers or sepsis pathophysiology  9)HypoNatremia/Hypokalemia--- replaced, normalized   Social/ethics--- with patient's permission Case discussed with patient's family friend Mr. Alfredo Martinez who was at bedside --The primary concern is that patient may not be compliant due to ongoing polysubstance abuse  -Patient currently does not have custody of her infant daughter  Disposition/Need for in-Hospital Stay- patient unable to be discharged at this time due to -CHF exacerbation requiring IV diuresis  Status is: Inpatient   Disposition: The patient is from: Home              Anticipated d/c is to: Home              Anticipated d/c date is: 1 day              Patient currently is not medically stable to d/c. Barriers: Not Clinically Stable-   Code Status :  -  Code Status: Full Code   Family Communication:    NA (patient is alert,  awake and coherent)   DVT Prophylaxis  :   - SCDs   SCDs Start: 08/28/21 0603   Lab Results  Component Value Date   PLT 466 (H) 08/30/2021   Inpatient Medications  Scheduled Meds:  carvedilol  6.25 mg Oral BID WC   ferrous sulfate  325 mg Oral Q breakfast   furosemide  40 mg Intravenous BID   lisinopril  5 mg Oral Daily   multivitamin  1 tablet Oral Daily   nicotine  14 mg Transdermal Daily   Continuous  Infusions:  heparin 1,150 Units/hr (08/30/21 0546)   PRN Meds:.acetaminophen **OR** acetaminophen, albuterol, hydrALAZINE, morphine injection, ondansetron **OR** ondansetron (ZOFRAN) IV, oxyCODONE   Anti-infectives (From admission, onward)    Start     Dose/Rate Route Frequency Ordered Stop   08/28/21 1515  cefTRIAXone (ROCEPHIN) 1 g in sodium chloride 0.9 % 100 mL IVPB        1 g 200 mL/hr over 30 Minutes Intravenous Every 24 hours 08/28/21 1424 08/30/21 1527      Subjective: Laura Wagner today has no fevers, no emesis,  No chest pain,   DOE improving, voiding well  Objective: Vitals:   08/30/21 0436 08/30/21 0823 08/30/21 0900 08/30/21 1236  BP: 124/87   110/71  Pulse: 97   92  Resp: 19   18  Temp: 97.8 F (36.6 C)   98.4 F (36.9 C)  TempSrc:      SpO2: (!) 87% 100%  100%  Weight: 54.4 kg  46.1 kg   Height: 4\' 11"  (1.499 m)       Intake/Output Summary (Last 24 hours) at 08/30/2021 1719 Last data filed at 08/30/2021 1300 Gross per 24 hour  Intake 960 ml  Output 3100 ml  Net -2140 ml   Filed Weights   08/29/21 0500 08/30/21 0436 08/30/21 0900  Weight: 47 kg 54.4 kg 46.1 kg    Physical Exam  Gen:- Awake Alert,  in no apparent distress  HEENT:- [place].AT, No sclera icterus Neck-Supple Neck,No JVD,.  Lungs-Improving air movement, no wheezing  CV- S1, S2 normal, regular , 3/6 S\M Abd-  +ve B.Sounds, Abd Soft, No tenderness,    Extremity/Skin:- No significant edema, pedal pulses present  Psych-affect is appropriate, oriented x3 Neuro-no new focal deficits, no tremors  Data Reviewed: I have personally reviewed following labs and imaging studies  CBC: Recent Labs  Lab 08/28/21 0352 08/28/21 1305 08/29/21 0712 08/30/21 0332  WBC 9.9 8.4 11.2* 11.6*  NEUTROABS 5.9  --   --   --   HGB 7.1* 8.0* 7.7* 7.6*  HCT 24.7* 27.5* 26.3* 26.8*  MCV 81.5 80.9 79.7* 80.7  PLT 420* 451* 427* 466*   Basic Metabolic Panel: Recent Labs  Lab 08/28/21 0352 08/28/21 1305  08/29/21 0712 08/30/21 0332  NA 137 135 133* 135  K 3.6 3.2* 3.4* 4.0  CL 105 98 99 99  CO2 25 28 27 28   GLUCOSE 109* 123* 95 88  BUN 16 13 15 17   CREATININE 1.12* 0.98 1.02* 1.07*  CALCIUM 8.1* 8.7* 7.7* 7.5*   GFR: Estimated Creatinine Clearance: 53.9 mL/min (A) (by C-G formula based on SCr of 1.07 mg/dL (H)). Liver Function Tests: Recent Labs  Lab 08/28/21 0352 08/28/21 1305 08/29/21 0712  AST 56* 47* 32  ALT 45* 45* 30  ALKPHOS 116 124 94  BILITOT 0.4 0.6 0.6  PROT 5.9* 6.5 5.3*  ALBUMIN 2.9* 3.2* 2.6*   Cardiac Enzymes: No results for input(s): "CKTOTAL", "CKMB", "CKMBINDEX", "  TROPONINI" in the last 168 hours. BNP (last 3 results) No results for input(s): "PROBNP" in the last 8760 hours. HbA1C: No results for input(s): "HGBA1C" in the last 72 hours. Sepsis Labs: @LABRCNTIP (procalcitonin:4,lacticidven:4) )No results found for this or any previous visit (from the past 240 hour(s)).    Radiology Studies: DG Chest 2 View  Result Date: 08/29/2021 CLINICAL DATA:  Cough and shortness of breath.  CHF. EXAM: CHEST - 2 VIEW COMPARISON:  Chest radiograph August 28, 2021. FINDINGS: Similar mild diffuse interstitial prominence. No confluent consolidation. No visible pneumothorax. Bilateral skin folds. Similar enlarged cardiac silhouette. Similar small bilateral pleural effusions. IMPRESSION: 1. Similar mild diffuse interstitial prominence, potentially mild interstitial edema. Other considerations include the sequela of recurrent bouts of CHF, viral/atypical respiratory infection or chronic interstitial lung disease. 2. Similar cardiomegaly and small bilateral pleural effusions. Electronically Signed   By: August 30, 2021 M.D.   On: 08/29/2021 08:42     Scheduled Meds:  carvedilol  6.25 mg Oral BID WC   ferrous sulfate  325 mg Oral Q breakfast   furosemide  40 mg Intravenous BID   lisinopril  5 mg Oral Daily   multivitamin  1 tablet Oral Daily   nicotine  14 mg Transdermal  Daily   Continuous Infusions:  heparin 1,150 Units/hr (08/30/21 0546)     LOS: 2 days   09/01/21 M.D on 08/30/2021 at 5:19 PM  Go to [email or web address] - for contact info  [facility] - Office  [phone]  If 7PM-7AM, please contact night-coverage [email or web address] 08/30/2021, 5:19 PM

## 2021-08-31 ENCOUNTER — Inpatient Hospital Stay (HOSPITAL_COMMUNITY): Payer: Medicaid Other

## 2021-08-31 DIAGNOSIS — I5021 Acute systolic (congestive) heart failure: Secondary | ICD-10-CM | POA: Diagnosis present

## 2021-08-31 LAB — CBC
HCT: 28.2 % — ABNORMAL LOW (ref 36.0–46.0)
Hemoglobin: 8 g/dL — ABNORMAL LOW (ref 12.0–15.0)
MCH: 22.7 pg — ABNORMAL LOW (ref 26.0–34.0)
MCHC: 28.4 g/dL — ABNORMAL LOW (ref 30.0–36.0)
MCV: 79.9 fL — ABNORMAL LOW (ref 80.0–100.0)
Platelets: 481 10*3/uL — ABNORMAL HIGH (ref 150–400)
RBC: 3.53 MIL/uL — ABNORMAL LOW (ref 3.87–5.11)
RDW: 18.6 % — ABNORMAL HIGH (ref 11.5–15.5)
WBC: 9.3 10*3/uL (ref 4.0–10.5)
nRBC: 0 % (ref 0.0–0.2)

## 2021-08-31 LAB — RENAL FUNCTION PANEL
Albumin: 2.7 g/dL — ABNORMAL LOW (ref 3.5–5.0)
Anion gap: 7 (ref 5–15)
BUN: 19 mg/dL (ref 6–20)
CO2: 27 mmol/L (ref 22–32)
Calcium: 8 mg/dL — ABNORMAL LOW (ref 8.9–10.3)
Chloride: 101 mmol/L (ref 98–111)
Creatinine, Ser: 1.02 mg/dL — ABNORMAL HIGH (ref 0.44–1.00)
GFR, Estimated: >60 mL/min (ref >60)
Glucose, Bld: 89 mg/dL (ref 70–99)
Phosphorus: 3.7 mg/dL (ref 2.5–4.6)
Potassium: 3.9 mmol/L (ref 3.5–5.1)
Sodium: 135 mmol/L (ref 135–145)

## 2021-08-31 LAB — HEPARIN LEVEL (UNFRACTIONATED): Heparin Unfractionated: 0.61 IU/mL (ref 0.30–0.70)

## 2021-08-31 LAB — BRAIN NATRIURETIC PEPTIDE: B Natriuretic Peptide: 384 pg/mL — ABNORMAL HIGH (ref 0.0–100.0)

## 2021-08-31 MED ORDER — LISINOPRIL 5 MG PO TABS: 5.0000 mg | ORAL_TABLET | Freq: Every day | ORAL | 3 refills | Status: DC

## 2021-08-31 MED ORDER — APIXABAN 5 MG PO TABS: 10.0000 mg | ORAL_TABLET | Freq: Two times a day (BID) | ORAL | Status: DC

## 2021-08-31 MED ORDER — FERROUS SULFATE 325 (65 FE) MG PO TABS: 325.0000 mg | ORAL_TABLET | Freq: Every day | ORAL | 3 refills | Status: AC

## 2021-08-31 MED ORDER — SPIRONOLACTONE 25 MG PO TABS: 12.5000 mg | ORAL_TABLET | Freq: Every morning | ORAL | 3 refills | Status: DC

## 2021-08-31 MED ORDER — NICOTINE 14 MG/24HR TD PT24: 14.0000 mg | MEDICATED_PATCH | Freq: Every day | TRANSDERMAL | 0 refills | Status: DC

## 2021-08-31 MED ORDER — APIXABAN 5 MG PO TABS: 5.0000 mg | ORAL_TABLET | Freq: Two times a day (BID) | ORAL | Status: DC

## 2021-08-31 MED ORDER — ELIQUIS DVT/PE STARTER PACK 5 MG PO TBPK: ORAL_TABLET | ORAL | 0 refills | Status: DC

## 2021-08-31 MED ORDER — CARVEDILOL 3.125 MG PO TABS
3.1250 mg | ORAL_TABLET | Freq: Two times a day (BID) | ORAL | Status: DC
  Administered 2021-08-31: 3.125 mg via ORAL
  Filled 2021-08-31: qty 1

## 2021-08-31 MED ORDER — FUROSEMIDE 40 MG PO TABS: 40.0000 mg | ORAL_TABLET | Freq: Every day | ORAL | 3 refills | Status: DC

## 2021-08-31 MED ORDER — APIXABAN 5 MG PO TABS: 5.0000 mg | ORAL_TABLET | Freq: Two times a day (BID) | ORAL | 5 refills | Status: DC

## 2021-08-31 MED ORDER — CARVEDILOL 3.125 MG PO TABS: 3.1250 mg | ORAL_TABLET | Freq: Two times a day (BID) | ORAL | 3 refills | Status: DC

## 2021-08-31 NOTE — Progress Notes (Signed)
IV's removed and discharge instructions reviewed.with patient and mother.  Mother to drive home.

## 2021-08-31 NOTE — Progress Notes (Signed)
ANTICOAGULATION CONSULT NOTE -  Pharmacy Consult for heparin Indication: PE  No Known Allergies  Patient Measurements: Height: 4\' 11"  (149.9 cm) Weight: 46.1 kg (101 lb 11.2 oz) IBW/kg (Calculated) : 43.2  Vital Signs: Temp: 98.6 F (37 C) (06/16 2000) Temp Source: Oral (06/16 2000) BP: 112/76 (06/16 2000) Pulse Rate: 90 (06/16 2000)  Labs: Recent Labs    08/29/21 0712 08/29/21 1337 08/30/21 0332 08/31/21 0549  HGB 7.7*  --  7.6* 8.0*  HCT 26.3*  --  26.8* 28.2*  PLT 427*  --  466* 481*  HEPARINUNFRC 0.66 0.54 0.53 0.61  CREATININE 1.02*  --  1.07* 1.02*    Estimated Creatinine Clearance: 56.5 mL/min (A) (by C-G formula based on SCr of 1.02 mg/dL (H)).  Medical History: Past Medical History:  Diagnosis Date   Gestational diabetes    Hypertension    Mental disorder    Assessment: 26yo postpartum female c/o SOB with cough >> had gone to OSH ED yesterday where CT revealed small RUL PE but pt left AMA, now presents to [facility] for treatment >> to begin heparin.  Of note pt has postpartum anemia; Hgb was 10.4 on day of delivery ~71mo ago 5/18 and was 6.9>7.4 on the days after, was supposed to take iron supplementation but has not started this.  Hgb - stable/improved HL - therapeutic   Goal of Therapy:  Heparin level 0.3-0.7 units/ml Monitor platelets by anticoagulation protocol: Yes   Plan:  Continue heparin infusion at 1150 units/hr. Monitor heparin levels and CBC. F/u long-term AC plans.  6/18, PharmD Clinical Pharmacist 08/31/2021 7:34 AM

## 2021-08-31 NOTE — Plan of Care (Signed)

## 2021-08-31 NOTE — Discharge Instructions (Signed)
1)You have congestive heart failure--- please take congestive heart failure medications including carvedilol, furosemide, spironolactone and lisinopril to help your heart get stronger 2)Very low-salt diet advised 3)Weigh yourself daily, call if you gain more than 3 pounds in 1 day or more than 5 pounds in 1 week as your diuretic medications may need to be adjusted 4)Take Eliquis/Apixaban which is a blood thinner as prescribed for your blood clots--- you will need to take it for about 6 months or so 5)Avoid ibuprofen/Advil/Aleve/Motrin/Goody Powders/Naproxen/BC powders/Meloxicam/Diclofenac/Indomethacin and other Nonsteroidal anti-inflammatory medications as these will make you more likely to bleed and can cause stomach ulcers, can also cause Kidney problems.  6)Complete Abstinence from Tobacco and illicit/street drugs strongly advised 7)Outpatient or inpatient drug rehab program strongly advised 8)Repeat CBC and BMP blood test with your primary care physician in the next 5 days or so advised 9)You need to follow-up with cardiologist for repeat echocardiogram test in about 3 months or so

## 2021-08-31 NOTE — Progress Notes (Addendum)
CSW spoke with pt by phone, with the help of RN.  Pt has new phone number: [phone].  Pt does have PCP at [facility] in [place], CSW encouraged her to make hospital discharge appointment within one week.  Pt does have medicaid, does get prescriptions filled at [facility].  CSW text pt eliquis benefit card information for 30 day supply.  Discussed talking with PCP for further medication assistance.  Pt verbalized understanding.  Pt reports her mother will pick her up from the hospital today.  Daleen Squibb, MSW, LCSW 6/17/202312:02 PM

## 2021-08-31 NOTE — Progress Notes (Signed)
ANTICOAGULATION CONSULT NOTE -  Pharmacy Consult for heparin >> APIXABAN Indication: PE  PER CSW:   CSW encouraged her to make hospital discharge appointment within one week.  Pt does have medicaid, does get prescriptions filled at [facility].  CSW text pt eliquis benefit card information for 30 day supply.  Discussed talking with PCP for further medication assistance.  Pt verbalized understanding.   No Known Allergies  Patient Measurements: Height: 4\' 11"  (149.9 cm) Weight: 45.6 kg (100 lb 9.6 oz) IBW/kg (Calculated) : 43.2  Vital Signs:    Labs: Recent Labs    08/29/21 0712 08/29/21 1337 08/30/21 0332 08/31/21 0549  HGB 7.7*  --  7.6* 8.0*  HCT 26.3*  --  26.8* 28.2*  PLT 427*  --  466* 481*  HEPARINUNFRC 0.66 0.54 0.53 0.61  CREATININE 1.02*  --  1.07* 1.02*    Estimated Creatinine Clearance: 56.5 mL/min (A) (by C-G formula based on SCr of 1.02 mg/dL (H)).  Medical History: Past Medical History:  Diagnosis Date   Gestational diabetes    Hypertension    Mental disorder    Assessment: 26yo postpartum female c/o SOB with cough >> had gone to OSH ED yesterday where CT revealed small RUL PE but pt left AMA, now presents to [facility] for treatment >> to begin heparin.  Of note pt has postpartum anemia; Hgb was 10.4 on day of delivery ~28mo ago 5/18 and was 6.9>7.4 on the days after, was supposed to take iron supplementation but has not started this.  Hgb - stable/improved HL - therapeutic   Goal of Therapy:  Monitor platelets by anticoagulation protocol: Yes   Plan:  Apixaban 10mg  po BID x 7 days then 5mg  po BID Pt has received Eliquis coupon card per notes  6/18, PharmD Clinical Pharmacist 08/31/2021 1:07 PM

## 2021-08-31 NOTE — Discharge Summary (Incomplete)
Laura Wagner, is a 26 y.o. female  DOB September 29, 1995  MRN [id number].  Admission date:  08/28/2021  Admitting Physician  Rolla Plate, DO  Discharge Date:  08/31/2021   Primary MD  Patient, No Pcp Per  Recommendations for primary care physician for things to follow:   1)You have congestive heart failure--- please take congestive heart failure medications including carvedilol, furosemide, spironolactone and lisinopril to help your heart get stronger 2)Very low-salt diet advised 3)Weigh yourself daily, call if you gain more than 3 pounds in 1 day or more than 5 pounds in 1 week as your diuretic medications may need to be adjusted 4)Take Eliquis/Apixaban which is a blood thinner as prescribed for your blood clots--- you will need to take it for about 6 months or so 5)Avoid ibuprofen/Advil/Aleve/Motrin/Goody Powders/Naproxen/BC powders/Meloxicam/Diclofenac/Indomethacin and other Nonsteroidal anti-inflammatory medications as these will make you more likely to bleed and can cause stomach ulcers, can also cause Kidney problems.  6)Complete Abstinence from Tobacco and illicit/street drugs strongly advised 7)Outpatient or inpatient drug rehab program strongly advised 8)Repeat CBC and BMP blood test with your primary care physician in the next 5 days or so advised 9)You need to follow-up with cardiologist for repeat echocardiogram test in about 3 months or so  Admission Diagnosis  CHF (congestive heart failure) ([place]) [I50.9] Acute pulmonary edema (HCC) [J81.0] Single subsegmental pulmonary embolism without acute cor pulmonale (HCC) [I26.93]   Discharge Diagnosis  CHF (congestive heart failure) ([place]) [I50.9] Acute pulmonary edema ([place]) [J81.0] Single subsegmental pulmonary embolism without acute cor pulmonale (HCC) [I26.93]  ***  Principal Problem:   Acute HFrEF (heart failure with reduced ejection  fraction) /postpartum cardiomyopathy with systolic dysfunction CHF Active Problems:   Acute pulmonary embolism (HCC)   Acute on chronic and deficiency anemia/postpartum related   Tobacco use disorder   Polysubstance abuse/including methamphetamine   Essential hypertension   Transaminitis   Hypoalbuminemia      Past Medical History:  Diagnosis Date   Gestational diabetes    Hypertension    Mental disorder     Past Surgical History:  Procedure Laterality Date   NO PAST SURGERIES         HPI  from the history and physical done on the day of admission:   HPI: Laura Wagner is a 26 y.o. female with medical history significant of gestational diabetes, hypertension, mood disorder, postpartum 3 weeks, presents ED with a chief complaint of dyspnea.  Patient reports that she has had swelling in her legs ever since she left the hospital after having her baby approximately 3 weeks ago.  That was at [facility].  Swelling has been progressively worse since it started.  Yesterday she started to have dyspnea.  It is worse on exertion.  She does not have orthopnea.  She admits to chest pain when she coughs.  She reports that she has had a productive cough with clear sputum.  She has had no fevers.  Patient reports palpitations when she feels anxious or when she  coughs.  She has no history of asthma.  She does not use any inhaler at home.  She is a current smoker of half a pack per day.  Patient did present to [facility] ER before coming here.  It is unclear why she left that ER.  She did have a CT scan there that showed a very small subsegmental pulmonary embolism in the left upper lobe.  She had a moderate right pleural effusion and a small left pleural effusion.  She also had a UDS that was positive for amphetamines.  Patient adamantly denies using amphetamines.  She does report she uses marijuana but the last time was a week ago.  Patient denies nausea, abdominal pain, headache.  Patient  has an elevated platelet count and a normal T. bili.  She does have a slight bump in her liver enzymes.  There is no abdominal tenderness on exam.   Patient is a current half a pack a day smoker.  She does not drink alcohol.  She uses marijuana, and has amphetamines on her UDS.  She is not vaccinated for COVID.  Patient is full code. Review of Systems: As mentioned in the history of present illness. All other systems reviewed and are negative.    Hospital Course:   Assessment and Plan: Brief Narrative:  Brief summary:-  26 y.o. 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 -Patient had [place] prior to delivery BNP 1,647, CXr consistent with CHF/Pulm Edema Troponin 28 >> 34 TSH 1.5 -Echo with EF of 35 to 40% and moderate concentric LVH, there is mild pulmonary hypertension, mild to moderate MR, mild to moderate TR -Fluid balance is negative and weight is trending down Wt 105 lb >> 103.6 >> 101.7 lb -Dyspnea on exertion improving -continue IV Lasix -Coreg, lisinopril and Aldactone added -She will need repeat echo in about 3 to 4 months, hopefully EF will improve over time with compliance with above medical therapy as well as treatment of acute PE -The primary concern is that patient may not be compliant due to ongoing polysubstance abuse  -   2)Acute pulmonary embolism--- continue IV heparin -Echo as above Anticipate dc home on DOAC   3)Polysubstance Abuse including amphetamines--- UDS noted, TOC consult requested   4)Nicotine dependence---continue nicotine patch-   5)AKI----acute kidney injury    creatinine on admission= 1.12 ,  baseline creatinine = 0.5     ,  -Watch renal function closely with diuresis --- renally adjust medications, avoid nephrotoxic agents / dehydration  / hypotension   6)Elevated LFTs--in the postpartum patient to have PIH and now has CHF -Suspect some degree of hepatic congestion -Monitor closely   7) Iron deficiency anemia due to postpartum blood loss--Hgb is down to 7.7, it was 10.4 on 05/16/2021 and 12 back in 2020 -Suspect postpartum related -Monitor closely especially with IV heparin/anticoagulation -Serum iron is 15, ferritin is 8, iron saturation 3% -No lochia or evidence of ongoing bleeding at this time -Continue iron supplementation   8)Possible UTI--- UA from 08/27/2021 at outside facility suggested possible UTI with positive leukocytes esterase, WBCs and bacteria on urine microscopy -Treated empirically with IV Rocephin -No leukocytosis, no fevers or sepsis pathophysiology   9)HypoNatremia/Hypokalemia--- replaced, normalized  Social/ethics--- with patient's permission Case discussed with patient's family friend Mr. Patrici Ranks who was at bedside --The primary concern is that patient may not be compliant due to ongoing polysubstance abuse  -Patient currently does not have custody of her infant daughter   Disposition--- patient unable to be discharged at this time due to -CHF exacerbation requiring IV diuresis    Disposition: The patient is from: Home              Anticipated d/c is to: Home              Code Status :  -  Code Status: Full Code    Family Communication:    NA (patient is alert, awake and coherent   Discharge Condition: ***  Follow UP   Follow-up Information     Practice, [facility]. Schedule an appointment as soon as possible for a visit in 2 day(s).   Why: Please make a follow up appointment with your MD for reevaluation and repeat CBC and BMP blood test in about 5 days or so Contact information: [place] [address] [address], [place], PA-C. Call on 12/01/2021.   Specialties: Physician Assistant, Cardiology Why: You will need follow-up appointment with cardiology for reevaluation and repeat echocardiogram in about 3 months or so Contact information: [address] [place] [address] [phone]                  Consults obtained - ***  Diet and Activity recommendation:  As advised  Discharge Instructions    **** Discharge Instructions     (HEART FAILURE PATIENTS) Call MD:  Anytime you have any of the following symptoms: 1) 3 pound weight gain in 24 hours or 5 pounds in 1 week 2) shortness of breath, with or without a dry hacking cough 3) swelling in the hands, feet or stomach 4) if you have to sleep on extra pillows at night in order to breathe.   Complete by: As directed    AMB referral to CHF clinic   Complete by: As directed    Call MD for:  difficulty breathing, headache or visual disturbances   Complete by: As directed    Call MD for:  persistant dizziness or light-headedness   Complete by: As directed    Call MD for:  persistant nausea and vomiting   Complete by: As directed    Call MD for:  temperature >100.4   Complete by: As directed    Diet - low sodium heart healthy   Complete by: As directed    Discharge instructions   Complete by: As directed    1)You have congestive heart failure--- please take congestive heart failure medications including carvedilol, furosemide, spironolactone and lisinopril to help your heart get stronger 2)Very low-salt diet advised 3)Weigh yourself daily, call if you gain more than 3 pounds in 1 day or more than 5 pounds in 1 week as your diuretic medications may need to be adjusted 4)Take Eliquis/Apixaban which is a blood thinner as prescribed for your blood clots--- you will need to take it for about 6 months or so 5)Avoid ibuprofen/Advil/Aleve/Motrin/Goody Powders/Naproxen/BC powders/Meloxicam/Diclofenac/Indomethacin and other Nonsteroidal anti-inflammatory  medications as these will make you more likely to bleed and can cause stomach ulcers, can also cause Kidney problems.  6)Complete Abstinence from Tobacco and illicit/street drugs strongly advised 7)Outpatient or inpatient drug rehab program strongly advised 8)Repeat CBC and  BMP blood test with your primary care physician in the next 5 days or so advised 9)You need to follow-up with cardiologist for repeat echocardiogram test in about 3 months or so   Increase activity slowly   Complete by: As directed          Discharge Medications     Allergies as of 08/31/2021   No Known Allergies      Medication List     STOP taking these medications    Accu-Chek Guide Me w/Device Kit   Accu-Chek Guide test strip Generic drug: glucose blood   Accu-Chek Softclix Lancets lancets   aspirin EC 81 MG tablet   Blood Pressure Monitor Misc       TAKE these medications    carvedilol 3.125 MG tablet Commonly known as: COREG Take 1 tablet (3.125 mg total) by mouth 2 (two) times daily with a meal. For Heart Failure   Eliquis DVT/PE Starter Pack Generic drug: Apixaban Starter Pack (40m and 544m Take as directed on package: start with two-87m64mablets twice daily for 7 days. On day 8, switch to one-87mg40mblet twice daily-- For Blood Clots   apixaban 5 MG Tabs tablet Commonly known as: ELIQUIS Take 1 tablet (5 mg total) by mouth 2 (two) times daily. For Blood Clots ---start around 09/27/2021 after completing initial Eliquis Starter pack Start taking on: September 27, 2021   ferrous sulfate 325 (65 FE) MG tablet Take 1 tablet (325 mg total) by mouth daily with breakfast. Start taking on: September 01, 2021 What changed: when to take this   furosemide 40 MG tablet Commonly known as: Lasix Take 1 tablet (40 mg total) by mouth daily. For Fluid/Heart Failure   lisinopril 5 MG tablet Commonly known as: ZESTRIL Take 1 tablet (5 mg total) by mouth daily. For Heart Failure Start taking on: September 01, 2021   nicotine 14 mg/24hr patch Commonly known as: NICODERM CQ - dosed in mg/24 hours Place 1 patch (14 mg total) onto the skin daily. Start taking on: September 01, 2021   spironolactone 25 MG tablet Commonly known as: ALDACTONE Take 0.5 tablets (12.5 mg total) by mouth every morning. For Heart Failure        Major procedures and Radiology Reports - PLEASE review detailed and final reports for all details, in brief -   ***  DG Chest 2 View  Result Date: 08/31/2021 CLINICAL DATA:  Respiratory abnormalities. EXAM: CHEST - 2 VIEW COMPARISON:  08/29/2021 FINDINGS: Lungs are adequately inflated without focal airspace consolidation. Subtle stable prominence of the interstitium. Mild stable cardiomegaly. Remainder of the exam is unchanged. IMPRESSION: 1. No acute cardiopulmonary disease. Mild stable diffuse interstitial prominence. 2. Mild stable cardiomegaly. Electronically Signed   By: DaniMarin Olp.   On: 08/31/2021 10:05   DG Chest 2 View  Result Date: 08/29/2021 CLINICAL DATA:  Cough and shortness of breath.  CHF. EXAM: CHEST - 2 VIEW COMPARISON:  Chest radiograph August 28, 2021. FINDINGS: Similar mild diffuse interstitial prominence. No confluent consolidation. No visible pneumothorax. Bilateral skin folds. Similar enlarged cardiac silhouette. Similar small bilateral pleural effusions. IMPRESSION: 1. Similar mild diffuse interstitial prominence, potentially mild interstitial edema. Other considerations include the sequela of recurrent bouts of CHF, viral/atypical respiratory infection or chronic interstitial lung disease. 2. Similar cardiomegaly and small bilateral pleural effusions. Electronically Signed   By: FredMargaretha Sheffield.   On: 08/29/2021 08:42   ECHOCARDIOGRAM COMPLETE  Result Date: 08/28/2021    ECHOCARDIOGRAM REPORT  Patient Name:   JARIELYS GIRARDOT Bracher Date of Exam: 08/28/2021 Medical Rec #:  [id number]          Height:       59.0 in Accession #:    [id number]         Weight:        115.0 lb Date of Birth:  19-Mar-1995         BSA:          1.458 m Patient Age:    25 years           BP:           137/99 mmHg Patient Gender: F                  HR:           105 bpm. Exam Location:  [facility] Procedure: 2D Echo, Cardiac Doppler and Color Doppler Indications:    Congestive Heart Failure I50.9  History:        Patient has no prior history of Echocardiogram examinations.                 CHF; Risk Factors:Current Smoker. Polysubstance abuse ([place]),                 Gestational diabetes, Drug-induced intensive care psychosis                 ([place]).  Sonographer:    Alvino Chapel RCS Referring Phys: [id number] ASIA B [place]  1. Left ventricular ejection fraction, by estimation, is 35 to 40%. The left ventricle has moderately decreased function. The left ventricle demonstrates global hypokinesis. There is moderate concentric left ventricular hypertrophy. Left ventricular diastolic function could not be evaluated.  2. Right ventricular systolic function is normal. The right ventricular size is normal. There is mildly elevated pulmonary artery systolic pressure. The estimated right ventricular systolic pressure is 34.9 mmHg.  3. Left atrial size was severely dilated.  4. Right atrial size was mild to moderately dilated.  5. The mitral valve is normal in structure. Mild to moderate mitral valve regurgitation. No evidence of mitral stenosis.  6. Tricuspid valve regurgitation is mild to moderate.  7. The aortic valve is tricuspid. Aortic valve regurgitation is not visualized. Aortic valve sclerosis/calcification is present, without any evidence of aortic stenosis.  8. The inferior vena cava is normal in size with greater than 50% respiratory variability, suggesting right atrial pressure of 3 mmHg. FINDINGS  Left Ventricle: Left ventricular ejection fraction, by estimation, is 35 to 40%. The left ventricle has moderately decreased function. The left ventricle demonstrates global  hypokinesis. The left ventricular internal cavity size was normal in size. There is moderate concentric left ventricular hypertrophy. Left ventricular diastolic function could not be evaluated. Right Ventricle: The right ventricular size is normal. No increase in right ventricular wall thickness. Right ventricular systolic function is normal. There is mildly elevated pulmonary artery systolic pressure. The tricuspid regurgitant velocity is 3.02  m/s, and with an assumed right atrial pressure of 3 mmHg, the estimated right ventricular systolic pressure is 17.9 mmHg. Left Atrium: Left atrial size was severely dilated. Right Atrium: Right atrial size was mild to moderately dilated. Pericardium: There is no evidence of pericardial effusion. Mitral Valve: The mitral valve is normal in structure. Mild to moderate mitral valve regurgitation, with posteriorly-directed jet. No evidence of mitral valve stenosis. Tricuspid Valve: The tricuspid valve is normal in structure. Tricuspid valve regurgitation is mild to  moderate. No evidence of tricuspid stenosis. Aortic Valve: The aortic valve is tricuspid. Aortic valve regurgitation is not visualized. Aortic valve sclerosis/calcification is present, without any evidence of aortic stenosis. Pulmonic Valve: The pulmonic valve was normal in structure. Pulmonic valve regurgitation is trivial. No evidence of pulmonic stenosis. Aorta: The aortic root is normal in size and structure. Venous: The inferior vena cava is normal in size with greater than 50% respiratory variability, suggesting right atrial pressure of 3 mmHg. IAS/Shunts: No atrial level shunt detected by color flow Doppler.  LEFT VENTRICLE PLAX 2D LVIDd:         5.10 cm LVIDs:         3.90 cm LV PW:         1.20 cm LV IVS:        1.30 cm LVOT diam:     2.10 cm LV SV:         42 LV SV Index:   29 LVOT Area:     3.46 cm  LV Volumes (MOD) LV vol d, MOD A2C: 130.0 ml LV vol d, MOD A4C: 138.0 ml LV vol s, MOD A2C: 74.5 ml LV vol  s, MOD A4C: 93.6 ml LV SV MOD A2C:     55.5 ml LV SV MOD A4C:     138.0 ml LV SV MOD BP:      50.7 ml RIGHT VENTRICLE RV S prime:     12.70 cm/s TAPSE (M-mode): 1.7 cm LEFT ATRIUM             Index        RIGHT ATRIUM           Index LA diam:        3.70 cm 2.54 cm/m   RA Area:     17.40 cm LA Vol (A2C):   79.7 ml 54.67 ml/m  RA Volume:   47.60 ml  32.65 ml/m LA Vol (A4C):   69.4 ml 47.61 ml/m LA Biplane Vol: 73.6 ml 50.49 ml/m  AORTIC VALVE LVOT Vmax:   86.50 cm/s LVOT Vmean:  64.600 cm/s LVOT VTI:    0.122 m  AORTA Ao Root diam: 3.00 cm MITRAL VALVE                TRICUSPID VALVE MV Area (PHT): 9.37 cm     TR Peak grad:   36.5 mmHg MV Decel Time: 81 msec      TR Vmax:        302.00 cm/s MR Peak grad: 133.6 mmHg MR Mean grad: 92.0 mmHg     SHUNTS MR Vmax:      578.00 cm/s   Systemic VTI:  0.12 m MR Vmean:     450.0 cm/s    Systemic Diam: 2.10 cm MV E velocity: 118.00 cm/s Fransico Him MD Electronically signed by Fransico Him MD Signature Date/Time: 08/28/2021/4:36:20 PM    Final    DG Chest Portable 1 View  Result Date: 08/28/2021 CLINICAL DATA:  26 year old female with shortness of breath and cough. Hypertension. Smoker. EXAM: PORTABLE CHEST 1 VIEW COMPARISON:  [facility] Chest CTA 08/27/2021 and earlier. FINDINGS: Portable AP upright view at 0404 hours. Stable cardiomegaly and mediastinal contours. Veiling opacity in the lower lung seen to be layering pleural effusions by CTA has regressed. Continued diffuse pulmonary interstitial opacity, nonspecific but does appear improved from portable chest yesterday. No areas of worsening ventilation or confluent opacity. No pneumothorax. Visualized tracheal air column is within normal limits. Paucity  of bowel gas in the upper abdomen. No acute osseous abnormality identified. IMPRESSION: 1. Cardiomegaly with evidence of decreased layering pleural effusions since yesterday. 2. Nonspecific diffuse pulmonary interstitial opacity. Slight improvement from  portable chest x-ray yesterday may indicate this is improving pulmonary edema. Other considerations include viral/atypical respiratory infection and chronic interstitial lung disease. Electronically Signed   By: Genevie Ann M.D.   On: 08/28/2021 04:22    Micro Results   *** Recent Results (from the past 240 hour(s))  Urine Culture     Status: Abnormal   Collection Time: 08/29/21  6:20 PM   Specimen: Urine, Clean Catch  Result Value Ref Range Status   Specimen Description   Final    URINE, CLEAN CATCH Performed at [facility], [address], [place], [place] [address]    Special Requests   Final    NONE Performed at [facility], [address], [place], [place] [address]    Culture (A)  Final    <10,000 COLONIES/mL INSIGNIFICANT GROWTH Performed at [facility], [place] [address], [place], [place] [address]    Report Status 08/30/2021 FINAL  Final    Today   Subjective    Berkleigh Deluna today has no ***          Patient has been seen and examined prior to discharge   Objective   Blood pressure 112/76, pulse 90, temperature 98.6 F (37 C), temperature source Oral, resp. rate 20, height _0  (1.499 m), weight 45.6 kg, SpO2 100 %, unknown if currently breastfeeding.   Intake/Output Summary (Last 24 hours) at 08/31/2021 1317 Last data filed at 08/31/2021 1100 Gross per 24 hour  Intake 720 ml  Output 2400 ml  Net -1680 ml    Exam Gen:- Awake Alert, no acute distress *** HEENT:- [place].AT, No sclera icterus Neck-Supple Neck,No JVD,.  Lungs-  CTAB , good air movement bilaterally CV- S1, S2 normal, regular Abd-  +ve B.Sounds, Abd Soft, No tenderness,    Extremity/Skin:- No  edema,   good pulses Psych-affect is appropriate, oriented x3 Neuro-no new focal deficits, no tremors ***   Data Review   CBC w Diff:  Lab Results  Component Value Date   WBC 9.3 08/31/2021   HGB 8.0 (L) 08/31/2021   HGB 10.4 (L) 05/16/2021   HCT 28.2 (L) 08/31/2021   HCT 31.1 (L) 05/16/2021    PLT 481 (H) 08/31/2021   PLT 307 05/16/2021   LYMPHOPCT 34 08/28/2021   MONOPCT 4 08/28/2021   EOSPCT 2 08/28/2021   BASOPCT 1 08/28/2021    CMP:  Lab Results  Component Value Date   NA 135 08/31/2021   NA 138 05/16/2021   K 3.9 08/31/2021   CL 101 08/31/2021   CO2 27 08/31/2021   BUN 19 08/31/2021   BUN 6 05/16/2021   CREATININE 1.02 (H) 08/31/2021   PROT 5.3 (L) 08/29/2021   PROT 6.1 05/16/2021   ALBUMIN 2.7 (L) 08/31/2021   ALBUMIN 3.6 (L) 05/16/2021   BILITOT 0.6 08/29/2021   BILITOT <0.2 05/16/2021   ALKPHOS 94 08/29/2021   AST 32 08/29/2021   ALT 30 08/29/2021  .  Total Discharge time is about 33 minutes  Roxan Hockey M.D on 08/31/2021 at 1:17 PM  Go to [email or web address] -  for contact info  [facility] - Office  [phone]

## 2021-11-26 ENCOUNTER — Other Ambulatory Visit (HOSPITAL_COMMUNITY): Payer: Self-pay

## 2021-11-26 ENCOUNTER — Telehealth (HOSPITAL_COMMUNITY): Payer: Self-pay

## 2021-11-26 ENCOUNTER — Ambulatory Visit (HOSPITAL_COMMUNITY)
Admission: RE | Admit: 2021-11-26 | Discharge: 2021-11-26 | Disposition: A | Discharge status: Home / Self Care | Payer: Medicaid Other | Source: Ambulatory Visit | Attending: Cardiology | Admitting: Cardiology

## 2021-11-26 VITALS — BP 154/70 | HR 92 | Wt 101.8 lb

## 2021-11-26 DIAGNOSIS — Z716 Tobacco abuse counseling: Secondary | ICD-10-CM | POA: Insufficient documentation

## 2021-11-26 DIAGNOSIS — Z79899 Other long term (current) drug therapy: Secondary | ICD-10-CM | POA: Insufficient documentation

## 2021-11-26 DIAGNOSIS — Z7901 Long term (current) use of anticoagulants: Secondary | ICD-10-CM | POA: Insufficient documentation

## 2021-11-26 DIAGNOSIS — I509 Heart failure, unspecified: Secondary | ICD-10-CM | POA: Diagnosis not present

## 2021-11-26 DIAGNOSIS — Z720 Tobacco use: Secondary | ICD-10-CM

## 2021-11-26 DIAGNOSIS — I10 Essential (primary) hypertension: Secondary | ICD-10-CM | POA: Diagnosis not present

## 2021-11-26 DIAGNOSIS — I110 Hypertensive heart disease with heart failure: Secondary | ICD-10-CM | POA: Insufficient documentation

## 2021-11-26 DIAGNOSIS — Z7984 Long term (current) use of oral hypoglycemic drugs: Secondary | ICD-10-CM | POA: Insufficient documentation

## 2021-11-26 DIAGNOSIS — I5020 Unspecified systolic (congestive) heart failure: Secondary | ICD-10-CM | POA: Diagnosis not present

## 2021-11-26 DIAGNOSIS — F17210 Nicotine dependence, cigarettes, uncomplicated: Secondary | ICD-10-CM | POA: Insufficient documentation

## 2021-11-26 DIAGNOSIS — F1990 Other psychoactive substance use, unspecified, uncomplicated: Secondary | ICD-10-CM

## 2021-11-26 LAB — BRAIN NATRIURETIC PEPTIDE: B Natriuretic Peptide: 13.8 pg/mL (ref 0.0–100.0)

## 2021-11-26 LAB — BASIC METABOLIC PANEL
Anion gap: 4 — ABNORMAL LOW (ref 5–15)
BUN: 9 mg/dL (ref 6–20)
CO2: 28 mmol/L (ref 22–32)
Calcium: 8.9 mg/dL (ref 8.9–10.3)
Chloride: 108 mmol/L (ref 98–111)
Creatinine, Ser: 0.76 mg/dL (ref 0.44–1.00)
GFR, Estimated: >60 mL/min (ref >60)
Glucose, Bld: 88 mg/dL (ref 70–99)
Potassium: 3.6 mmol/L (ref 3.5–5.1)
Sodium: 140 mmol/L (ref 135–145)

## 2021-11-26 MED ORDER — DAPAGLIFLOZIN PROPANEDIOL 10 MG PO TABS: 10.0000 mg | ORAL_TABLET | Freq: Every day | ORAL | 11 refills | Status: AC

## 2021-11-26 MED ORDER — ENTRESTO 24-26 MG PO TABS: 1.0000 | ORAL_TABLET | Freq: Two times a day (BID) | ORAL | 11 refills | Status: DC

## 2021-11-26 MED ORDER — CARVEDILOL 6.25 MG PO TABS: 6.2500 mg | ORAL_TABLET | Freq: Two times a day (BID) | ORAL | 3 refills | Status: DC

## 2021-11-26 NOTE — Telephone Encounter (Signed)
Advanced Heart Failure Patient Advocate Encounter   Prior authorization is required for Farxiga 10MG .  Submitted: 11/26/2021 Key [id number]   01/26/2022, CPhT Rx Patient Advocate Phone: [phone]

## 2021-11-26 NOTE — Telephone Encounter (Signed)
Advanced Heart Failure Patient Advocate Encounter  Prior Authorization for Sherryll Burger has been approved.   Effective: 11/12/2021 to 11/26/2022  Test billing of this medication returns a $0 copay for 90 days.  Burnell Blanks, CPhT Rx Patient Advocate Phone: [phone]

## 2021-11-26 NOTE — Telephone Encounter (Signed)
Advanced Heart Failure Patient Advocate Encounter  Prior authorization is required for Entresto 24-26MG   Submitted: 11/26/2021 Key [id number]   Burnell Blanks, CPhT Rx Patient Advocate Phone: [phone]

## 2021-11-26 NOTE — Patient Instructions (Signed)
STOP Lisinopril  START Entresto 36 hours after stopping Lisinopril ( 09/13 in the pm)  START farxiga 10mg  daily.  Labs done today, your results will be available in MyChart, we will contact you for abnormal readings.  Your physician has requested that you have an echocardiogram. Echocardiography is a painless test that uses sound waves to create images of your heart. It provides your doctor with information about the size and shape of your heart and how well your heart's chambers and valves are working. This procedure takes approximately one hour. There are no restrictions for this procedure. The office in [place] will call you to arrange the echo.    Please follow up with our heart failure pharmacist in 3 weeks  Your physician recommends that you schedule a follow-up appointment as scheduled.  If you have any questions or concerns before your next appointment please send [place] a message through [facility] or call our office at [phone].    TO LEAVE A MESSAGE FOR THE NURSE SELECT OPTION 2, PLEASE LEAVE A MESSAGE INCLUDING: YOUR NAME DATE OF BIRTH CALL BACK NUMBER REASON FOR CALL**this is important as we prioritize the call backs  YOU WILL RECEIVE A CALL BACK THE SAME DAY AS LONG AS YOU CALL BEFORE 4:00 PM  At the [facility], you and your health needs are our priority. As part of our continuing mission to provide you with exceptional heart care, we have created designated Provider Care Teams. These Care Teams include your primary Cardiologist (physician) and Advanced Practice Providers (APPs- Physician Assistants and Nurse Practitioners) who all work together to provide you with the care you need, when you need it.   You may see any of the following providers on your designated Care Team at your next follow up: Dr [phone] Dr Arvilla Meres Dr. Marca Ancona, NP Marcos Eke, Robbie Lis [facility] [place], [place], NP Brynda Peon,  PharmD   Please be sure to bring in all your medications bottles to every appointment.

## 2021-11-26 NOTE — Progress Notes (Signed)
ADVANCED HEART FAILURE CLINIC NOTE  Referring Physician: Dr. Mariea Clonts [facility] Primary Care: N/A  HPI: Laura Wagner Laura Wagner is a 26 y.o. female presenting today to establish care after recently being diagnosed with HFrEF. According to Ms. Procter, she has no prior medical history. She has smoked cigarettes since age 74 and unfortunately has used amphetamines over the past 2-3 years. In May 2023, she gave birth to her 2nd child. During her last month of pregnancy she reports being increasingly SOB with worsening LE edema. Roughly 3 weeks after delivery, she came to the ER due to progressive dyspnea, edema and decreased functional status. During that time TTE confirmed LVEF of 35%. She was started on some GDMT and discharged home.   Since discharge, she has been taking lisinopril 5mg  daily and lasix 40mg  daily. She does not report of any hospitalizations for heart failure or difficulty managing her volume status. From a functional standpoint, she reports being able to walk >166ft without difficulty; although, she does struggle with SOB 2-3/weekly. Reports compliance with medications.  Activity level/exercise tolerance:  NYHA II Orthopnea:  Sleeps on 1-2 pillows Paroxysmal noctural dyspnea:  No Chest pain/pressure:  No Orthostatic lightheadedness:  No Palpitations:  No Lower extremity edema:  No Presyncope/syncope:  No Cough:  No  ROS: CONSTITUTIONAL: Patient has had no recent weight changes.  No fever, chills or night sweats.  HEENT: No nasal discharge, no allergies, no epistaxis.  PULMONARY: See the present illness. CARDIAC: See the present illness.  GI: No diarrhea, constipation, melena or hematochezia. No abdominal pain. GU: No dysuria, no hematuria, no increased frequency or hesitancy.  NEUROLOGIC: No stroke or TIA symptoms.  MS: Patient complains of no arthritis/musculoskeletal pains  ENDOCRINE: No thyroid, adrenal, or pituitary problems.  SKIN: No rash, no itching, no pigmentation  changes, no excessive moisture or dryness.  PSYCHIATRIC: No recent depressive symptoms.  LYMPHATIC: No lymph node swelling.   Past Medical History:  Diagnosis Date   Gestational diabetes    Hypertension    Mental disorder     Current Outpatient Medications  Medication Sig Dispense Refill   apixaban (ELIQUIS) 5 MG TABS tablet Take 1 tablet (5 mg total) by mouth 2 (two) times daily. For Blood Clots ---start around 09/27/2021 after completing initial Eliquis Starter pack 60 tablet 5   Apixaban Starter Pack, 10mg  and 5mg , (ELIQUIS DVT/PE STARTER PACK) Take as directed on package: start with two-5mg  tablets twice daily for 7 days. On day 8, switch to one-5mg  tablet twice daily-- For Blood Clots 1 each 0   dapagliflozin propanediol (FARXIGA) 10 MG TABS tablet Take 1 tablet (10 mg total) by mouth daily before breakfast. 30 tablet 11   ferrous sulfate 325 (65 FE) MG tablet Take 1 tablet (325 mg total) by mouth daily with breakfast. 90 tablet 3   furosemide (LASIX) 40 MG tablet Take 1 tablet (40 mg total) by mouth daily. For Fluid/Heart Failure 90 tablet 3   sacubitril-valsartan (ENTRESTO) 24-26 MG Take 1 tablet by mouth 2 (two) times daily. 60 tablet 11   spironolactone (ALDACTONE) 25 MG tablet Take 0.5 tablets (12.5 mg total) by mouth every morning. For Heart Failure 45 tablet 3   carvedilol (COREG) 6.25 MG tablet Take 1 tablet (6.25 mg total) by mouth 2 (two) times daily with a meal. For Heart Failure 180 tablet 3   No current facility-administered medications for this encounter.    No Known Allergies    Social History   Socioeconomic History   Marital  status: Single    Spouse name: Not on file   Number of children: Not on file   Years of education: Not on file   Highest education level: Not on file  Occupational History   Not on file  Tobacco Use   Smoking status: Every Day    Packs/day: 0.50    Years: 9.00    Total pack years: 4.50    Types: Cigarettes   Smokeless tobacco:  Never  Vaping Use   Vaping Use: Never used  Substance and Sexual Activity   Alcohol use: No   Drug use: Yes    Types: Marijuana    Comment: "THC occ"   Sexual activity: Yes    Birth control/protection: None  Other Topics Concern   Not on file  Social History Narrative   Not on file   Social Determinants of Health   Financial Resource Strain: Low Risk  (05/16/2021)   Overall Financial Resource Strain (CARDIA)    Difficulty of Paying Living Expenses: Not hard at all  Food Insecurity: No Food Insecurity (05/16/2021)   Hunger Vital Sign    Worried About Running Out of Food in the Last Year: Never true    Ran Out of Food in the Last Year: Never true  Transportation Needs: No Transportation Needs (05/16/2021)   PRAPARE - Administrator, Civil Service (Medical): No    Lack of Transportation (Non-Medical): No  Physical Activity: Insufficiently Active (05/16/2021)   Exercise Vital Sign    Days of Exercise per Week: 3 days    Minutes of Exercise per Session: 30 min  Stress: Stress Concern Present (05/16/2021)   [employer] of Occupational Health - Occupational Stress Questionnaire    Feeling of Stress : To some extent  Social Connections: Socially Isolated (05/16/2021)   Social Connection and Isolation Panel [NHANES]    Frequency of Communication with Friends and Family: Twice a week    Frequency of Social Gatherings with Friends and Family: Once a week    Attends Religious Services: Never    Database administrator or Organizations: No    Attends Banker Meetings: Never    Marital Status: Never married  Intimate Partner Violence: Not At Risk (05/16/2021)   Humiliation, Afraid, Rape, and Kick questionnaire    Fear of Current or Ex-Partner: No    Emotionally Abused: No    Physically Abused: No    Sexually Abused: No      Family History  Problem Relation Age of Onset   Asthma Brother    Cancer Maternal Grandmother        breast    Vitals:   11/26/21 1056   BP: (!) 154/70  Pulse: 92  SpO2: 98%  Weight: 46.2 kg (101 lb 12.8 oz)    PHYSICAL EXAM: GENERAL: thin WF in NAD.  NECK: Supple, No masses. Normal carotid upstrokes without bruits. No masses or thyromegaly.    CHEST: There are no chest wall deformities. There is no chest wall tenderness. Respirations are unlabored.  Lungs- coarse lung sounds; no crackles or rales CARDIAC:  JVP: 7-8cm H2O         PMI: No murmurs; nS1S2 Normal rate with regular rhythm. No murmurs, rubs or gallops.  Pulses are 2+ and symmetrical in upper and lower extremities. No edema.  ABDOMEN: Soft, non-tender, non-distended. There are no masses or hepatomegaly. There are normal bowel sounds.  EXTREMITIES: Warm and well perfused with no cyanosis, clubbing.  LYMPHATIC: No axillary  or supraclavicular lymphadenopathy.  NEUROLOGIC: Patient is oriented x3 with no focal or lateralizing neurologic deficits.  PSYCH: Patients affect is appropriate, there is no evidence of anxiety or depression.  SKIN: Warm and dry; no lesions or wounds.   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 She is now out of the window for bromocriptine and I do not suspect CAD given her age. Will repeat TTE as soon as possible. If her LVEF remains low then we will plan to pursue RHC/LHC and possible CMR.  NYHA class / AHA Stage: II Volume status & Diuretics: Euvolemic, on lasix 40mg  daily; will likely need to decrease. Vasodilators: D/C lisinopril 5 mg; start Entresto 24/26 twice daily Beta-Blocker: Increase Coreg to 6.25 twice daily MRA: Continue spironolactone 12.5 mg daily Cardiometabolic: Start Farxiga 10 mg daily Devices therapies & Valvulopathies: We will repeat echo  in 2 months Advanced therapies: Currently not indicated  2. Hypertension  - Starting entresto 24/26mg  BID; will uptitrate aggressively   3. Tobacco use  - Smoking for the past 9 years; discussed tobacco cessation at length today  4. Hx of drug use  - Reports cessation from recent drug use.   Follow-up:  2 weeks with pharmacy for med titration, 4-5 weeks follow-up.  [facility]

## 2021-11-27 ENCOUNTER — Other Ambulatory Visit (HOSPITAL_COMMUNITY): Payer: Self-pay

## 2021-11-27 NOTE — Telephone Encounter (Signed)
Patient Advocate Encounter  Prior Authorization for Marcelline Deist has been approved.   Effective: 11/26/2021 "until further notice"  Test billing for this medication returns a $0 copay for 90 days.  Burnell Blanks, CPhT Rx Patient Advocate Phone: [phone]

## 2021-12-10 ENCOUNTER — Encounter: Payer: Self-pay | Admitting: Cardiology

## 2021-12-18 ENCOUNTER — Inpatient Hospital Stay (HOSPITAL_COMMUNITY): Admission: RE | Admit: 2021-12-18 | Payer: Medicaid Other | Source: Ambulatory Visit

## 2022-01-22 ENCOUNTER — Encounter (HOSPITAL_COMMUNITY): Payer: Medicaid Other | Admitting: Cardiology

## 2022-01-27 ENCOUNTER — Encounter (HOSPITAL_COMMUNITY): Payer: Medicaid Other | Admitting: Cardiology

## 2022-01-28 ENCOUNTER — Encounter (HOSPITAL_COMMUNITY): Payer: Medicaid Other | Admitting: Cardiology

## 2022-04-18 ENCOUNTER — Emergency Department (HOSPITAL_COMMUNITY): Payer: Medicaid Other

## 2022-04-18 ENCOUNTER — Encounter (HOSPITAL_COMMUNITY): Payer: Self-pay

## 2022-04-18 ENCOUNTER — Emergency Department (HOSPITAL_COMMUNITY)
Admission: EM | Admit: 2022-04-18 | Discharge: 2022-04-18 | Disposition: A | Discharge status: Home / Self Care | Payer: Medicaid Other | Attending: Emergency Medicine | Admitting: Emergency Medicine

## 2022-04-18 DIAGNOSIS — S82831A Other fracture of upper and lower end of right fibula, initial encounter for closed fracture: Secondary | ICD-10-CM | POA: Diagnosis not present

## 2022-04-18 DIAGNOSIS — S82301A Unspecified fracture of lower end of right tibia, initial encounter for closed fracture: Secondary | ICD-10-CM | POA: Diagnosis not present

## 2022-04-18 DIAGNOSIS — W228XXA Striking against or struck by other objects, initial encounter: Secondary | ICD-10-CM | POA: Insufficient documentation

## 2022-04-18 DIAGNOSIS — S99911A Unspecified injury of right ankle, initial encounter: Secondary | ICD-10-CM | POA: Diagnosis present

## 2022-04-18 DIAGNOSIS — Z79899 Other long term (current) drug therapy: Secondary | ICD-10-CM | POA: Diagnosis not present

## 2022-04-18 DIAGNOSIS — S82201A Unspecified fracture of shaft of right tibia, initial encounter for closed fracture: Secondary | ICD-10-CM

## 2022-04-18 DIAGNOSIS — I10 Essential (primary) hypertension: Secondary | ICD-10-CM | POA: Diagnosis not present

## 2022-04-18 DIAGNOSIS — Z7901 Long term (current) use of anticoagulants: Secondary | ICD-10-CM | POA: Insufficient documentation

## 2022-04-18 HISTORY — DX: Major depressive disorder, single episode, unspecified: F32.9

## 2022-04-18 HISTORY — DX: Other psychoactive substance use, unspecified, uncomplicated: F19.90

## 2022-04-18 HISTORY — DX: Other psychoactive substance abuse, uncomplicated: F19.10

## 2022-04-18 HISTORY — DX: Peripartum cardiomyopathy: O90.3

## 2022-04-18 HISTORY — DX: Heart failure, unspecified: I50.9

## 2022-04-18 MED ORDER — HYDROCODONE-ACETAMINOPHEN 5-325 MG PO TABS
2.0000 | ORAL_TABLET | Freq: Once | ORAL | Status: AC
  Administered 2022-04-18: 2 via ORAL
  Filled 2022-04-18: qty 2

## 2022-04-18 NOTE — ED Triage Notes (Addendum)
Pt came in POV for right foot injury, reports "slammed my foot in a car door 2 days ago", pt reports seen at [facility], however their x-ray machine was "down". Pt has noticeable swelling with pitting edema to right foot, healing stages of bruising noted to the right foot and LE. 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 Anterior tibial pulse present, pt able to wiggle her toes and has full sensation to right foot. Hx of HTN, CHF and recreational drug use.   Injury was on Jan 28 according to [facility] notes, seen for same as reported tonight, was told to seek ED for care, however pt did not go to ED at that time.

## 2022-04-18 NOTE — ED Notes (Signed)
Ice pack applied to pt's right foot/LE.

## 2022-04-18 NOTE — Discharge Instructions (Signed)
Keep the area elevated and ice it.  Do not put any weight on the right leg.  Call Dr. Ruthe Mannan office this morning to schedule follow-up for next week.  You will need surgery to repair this fracture.

## 2022-04-18 NOTE — ED Provider Notes (Signed)
[facility] Provider Note   CSN: [id number] Arrival date & time: 04/18/22  0100     History  Chief Complaint  Patient presents with   Foot Injury    Laura Wagner is a 27 y.o. female.  Patient presents to the emergency department for evaluation of persistent pain, swelling, bruising of the left foot and ankle.  Patient reports that she slammed her foot in a car door by accident on January 28.  She was seen at urgent care but they were unable to perform x-rays at that time.  She has not had any improvement.       Home Medications Prior to Admission medications   Medication Sig Start Date End Date Taking? Authorizing Provider  apixaban (ELIQUIS) 5 MG TABS tablet Take 1 tablet (5 mg total) by mouth 2 (two) times daily. For Blood Clots ---start around 09/27/2021 after completing initial Eliquis Starter pack 09/27/21   Roxan Hockey, MD  Apixaban Starter Pack, 10mg  and 5mg , (ELIQUIS DVT/PE STARTER PACK) Take as directed on package: start with two-5mg  tablets twice daily for 7 days. On day 8, switch to one-5mg  tablet twice daily-- For Blood Clots 08/31/21   Roxan Hockey, MD  carvedilol (COREG) 6.25 MG tablet Take 1 tablet (6.25 mg total) by mouth 2 (two) times daily with a meal. For Heart Failure 11/26/21   Sabharwal, Aditya, DO  dapagliflozin propanediol (FARXIGA) 10 MG TABS tablet Take 1 tablet (10 mg total) by mouth daily before breakfast. 11/26/21   Sabharwal, Aditya, DO  ferrous sulfate 325 (65 FE) MG tablet Take 1 tablet (325 mg total) by mouth daily with breakfast. 09/01/21   Denton Brick, Courage, MD  furosemide (LASIX) 40 MG tablet Take 1 tablet (40 mg total) by mouth daily. For Fluid/Heart Failure 08/31/21 08/31/22  Roxan Hockey, MD  sacubitril-valsartan (ENTRESTO) 24-26 MG Take 1 tablet by mouth 2 (two) times daily. 11/26/21   Sabharwal, Aditya, DO  spironolactone (ALDACTONE) 25 MG tablet Take 0.5 tablets (12.5 mg total) by mouth  every morning. For Heart Failure 08/31/21   Roxan Hockey, MD      Allergies    Patient has no known allergies.    Review of Systems   Review of Systems  Physical Exam Updated Vital Signs BP (!) 184/135   Pulse (!) 109   Temp 98 F (36.7 C) (Oral)   Resp 16   Ht 4\' 11"  (1.499 m)   Wt 59 kg   LMP  (LMP Unknown) Comment: "last month"  SpO2 100%   BMI 26.26 kg/m  Physical Exam  ED Results / Procedures / Treatments   Labs (all labs ordered are listed, but only abnormal results are displayed) Labs Reviewed - No data to display  EKG None  Radiology DG Ankle Complete Right  Result Date: 04/18/2022 CLINICAL DATA:  Slammed right leg in car door EXAM: RIGHT ANKLE - COMPLETE 3+ VIEW; RIGHT FOOT COMPLETE - 3+ VIEW COMPARISON:  None Available. FINDINGS: Acute comminuted transverse fracture of the distal right tibial metadiaphysis. Oblique comminuted fracture through the distal right fibular diaphysis. Apex medial angulation of both fractures. Small fracture fragment lateral to the talus suspicious for a lateral talar process fracture. Widening of the lateral ankle mortise. Soft tissue swelling about the ankle. Tiny osseous fragment in the 1st-2nd metatarsal space. No widening of the 1st-2nd metatarsal space. Normal alignment of the first and second metatarsals with the medial and middle cuneiform bones. Irregular lucency through a metatarsal heart  cuneiform bone on lateral view with adjacent marked swelling over the dorsum of the foot. IMPRESSION: Acute comminuted fractures of the distal right tibia and fibula as described. Suspected fracture of the lateral talar process. There is a small osseous fragment in the Lisfranc joint with suspected fracture of a metatarsal or cuneiform bone on lateral view. Consider CT for further evaluation. Swelling about the ankle and dorsum of the foot. Electronically Signed   By: Placido Sou M.D.   On: 04/18/2022 02:14   DG Foot Complete Right  Result  Date: 04/18/2022 CLINICAL DATA:  Slammed right leg in car door EXAM: RIGHT ANKLE - COMPLETE 3+ VIEW; RIGHT FOOT COMPLETE - 3+ VIEW COMPARISON:  None Available. FINDINGS: Acute comminuted transverse fracture of the distal right tibial metadiaphysis. Oblique comminuted fracture through the distal right fibular diaphysis. Apex medial angulation of both fractures. Small fracture fragment lateral to the talus suspicious for a lateral talar process fracture. Widening of the lateral ankle mortise. Soft tissue swelling about the ankle. Tiny osseous fragment in the 1st-2nd metatarsal space. No widening of the 1st-2nd metatarsal space. Normal alignment of the first and second metatarsals with the medial and middle cuneiform bones. Irregular lucency through a metatarsal heart cuneiform bone on lateral view with adjacent marked swelling over the dorsum of the foot. IMPRESSION: Acute comminuted fractures of the distal right tibia and fibula as described. Suspected fracture of the lateral talar process. There is a small osseous fragment in the Lisfranc joint with suspected fracture of a metatarsal or cuneiform bone on lateral view. Consider CT for further evaluation. Swelling about the ankle and dorsum of the foot. Electronically Signed   By: Placido Sou M.D.   On: 04/18/2022 02:14    Procedures Procedures    Medications Ordered in ED Medications - No data to display  ED Course/ Medical Decision Making/ A&P                             Medical Decision Making Amount and/or Complexity of Data Reviewed Radiology: ordered.   Patient presents to the emergency department for evaluation of right foot and ankle injury.  Injury occurred several days ago.  She reports that the ankle is closed in a car door.  Examination reveals fairly significant bruising and swelling, decreased range of motion of the right ankle.  X-ray shows fracture of the distal tibia and fibula with some angulation.  Patient is noncompliant with  her medications.  She has a fairly significant medical history.  She was counseled that she needs to take her medications to control her blood pressure and heart failure.  Patient counseled that she needs to follow-up with primary care soon as possible.  Refer to orthopedics for likely surgical intervention of the fracture.  SPLINT APPLICATION Date/Time: 7:82 AM Authorized by: Orpah Greek Consent: Verbal consent obtained. Risks and benefits: risks, benefits and alternatives were discussed Consent given by: patient Splint applied by: nurse  Location details: right leg Splint type: posterior + stirrup Supplies used: orthoglass Post-procedure: The splinted body part was neurovascularly unchanged following the procedure. Patient tolerance: Patient tolerated the procedure well with no immediate complications.          Final Clinical Impression(s) / ED Diagnoses Final diagnoses:  Closed fracture of right tibia and fibula, initial encounter  Primary hypertension    Rx / DC Orders ED Discharge Orders     None  Orpah Greek, MD 04/18/22 [phone]

## 2022-04-18 NOTE — ED Notes (Signed)
Dr. Pollina at the bedside.  

## 2022-04-22 ENCOUNTER — Ambulatory Visit: Payer: Medicaid Other | Admitting: Orthopedic Surgery

## 2022-04-22 ENCOUNTER — Encounter: Payer: Self-pay | Admitting: Radiology

## 2022-04-22 NOTE — Telephone Encounter (Signed)
Per Dr. Amedeo Kinsman, this patient needs to see a foot specialist.  I have tried to contact the patient by phone, I have lvm for the patient's mother to please have the patient to call our office.  Abigail Butts and I have both sent the patient a mychart message asking her to call concerning scheduling.

## 2022-04-29 ENCOUNTER — Telehealth: Payer: Self-pay | Admitting: Orthopedic Surgery

## 2022-04-29 NOTE — Telephone Encounter (Signed)
Patient called Laura Wagner to schedule.  Discussed w/Wendy and Abigail Butts got her scheduled w/Dr. Sharol Given for Thursday, 05/01/22 at 8:15am.  I have given the patient the address and phone for Dr. Jess Barters office and asked her to be there between 7:45am - 8am, pt understood.

## 2022-05-01 ENCOUNTER — Encounter: Payer: Self-pay | Admitting: Orthopedic Surgery

## 2022-05-01 ENCOUNTER — Encounter (HOSPITAL_COMMUNITY): Payer: Self-pay | Admitting: Vascular Surgery

## 2022-05-01 ENCOUNTER — Ambulatory Visit (INDEPENDENT_AMBULATORY_CARE_PROVIDER_SITE_OTHER): Payer: Medicaid Other | Admitting: Orthopedic Surgery

## 2022-05-01 ENCOUNTER — Ambulatory Visit (INDEPENDENT_AMBULATORY_CARE_PROVIDER_SITE_OTHER): Payer: Medicaid Other

## 2022-05-01 ENCOUNTER — Encounter (HOSPITAL_COMMUNITY): Payer: Self-pay | Admitting: Orthopedic Surgery

## 2022-05-01 ENCOUNTER — Other Ambulatory Visit: Payer: Self-pay

## 2022-05-01 DIAGNOSIS — M25571 Pain in right ankle and joints of right foot: Secondary | ICD-10-CM

## 2022-05-01 DIAGNOSIS — S8261XA Displaced fracture of lateral malleolus of right fibula, initial encounter for closed fracture: Secondary | ICD-10-CM

## 2022-05-01 DIAGNOSIS — S82871A Displaced pilon fracture of right tibia, initial encounter for closed fracture: Secondary | ICD-10-CM

## 2022-05-01 NOTE — Progress Notes (Signed)
Anesthesia Chart Review: Laura Wagner  Case: [id number] Date/Time: 05/02/22 1241   Procedure: OPEN REDUCTION INTERNAL FIXATION RIGHT FIBULA, TIBIA (Right: Ankle)   Anesthesia type: Choice   Pre-op diagnosis: Pilon Fracture, Fibula Fracture, Lisfranc Fracture Right   Location: [facility] OR ROOM 06 / [place] OR   Surgeons: Newt Minion, MD       DISCUSSION: Patient is a 27 year old female scheduled for the above procedure. Car door slammed against her right ankle ~ 04/13/22. Seen in ED on 04/18/22 diagnosed with a comminuted displaced right ankle pilon fracture with intra-articular extension. Has been nonweightbearing and wearing a posterior splint. Seen by Dr. Sharol Given on 05/01/22 and the above surgery recommended.   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 Very small subsegmental left upper lobe pulmonary embolus  She was evaluated by HF cardiologist Hebert Soho, DO on 11/26/21. He notes polysubstance use and recent birth of second child 08/01/21. She developed progressive SOB and LE edema towards the end of her pregnancy which continued several weeks post-partum. On 08/27/21 she had an ED visit at [facility] for symptoms. CTA showed a small subsegmental LUL PE, but she left AMA. She did present to [facility] on 08/29/22 and was admitted for acute PE and started on anticoagulation therapy. An echocardiogram was done as part of her work-up and showed LVEF 35%. She was started on GDMT and referred for out-patient cardiology evaluation. Eliquis was recommended for six months. 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 He did not suspected CAD given her age, but would consider pursuing RHC/LHC  if her EF did not improve. His note does not mention if he saw the results of a limited bedside POC echo at [facility] that showed LVEF > 50%. I don't see any subsequent cardiology follow-up.  PAT RN interview is pending, but pharmacy listed that she is not taking any of the medications listed. She has an acute fracture. Discussed with anesthesiologist Suella Broad, MD. Anesthesia team to evaluate on the day of surgery.    VS: LMP  (LMP Unknown) Comment: "last month" BP Readings from Last 3 Encounters:  04/18/22 (!) 170/113  11/26/21 (!) 154/70  08/30/21 112/76   Pulse Readings from Last 3 Encounters:  04/18/22 (!) 108  11/26/21 92  08/30/21 90     PROVIDERS: Patient, No Pcp Per Hebert Soho, DO is HF cardiologist   LABS: For day of surgery as indicated. Last results in [facility] include: Lab Results  Component Value Date   WBC 9.3 08/31/2021   HGB 8.0 (L) 08/31/2021   HCT 28.2 (L) 08/31/2021   PLT 481 (H) 08/31/2021   GLUCOSE 88 11/26/2021   ALT 30 08/29/2021   AST 32 08/29/2021   NA 140 11/26/2021   K 3.6 11/26/2021   CL 108 11/26/2021   CREATININE 0.76 11/26/2021   BUN 9 11/26/2021   CO2 28 11/26/2021   TSH 1.538 08/28/2021     IMAGES: Xray right foot/ankle 04/18/22: IMPRESSION: - Acute comminuted fractures of the distal right tibia and fibula as described. - Suspected fracture of the lateral talar process. - There is a small osseous fragment in the Lisfranc joint with suspected fracture of a metatarsal or cuneiform bone on lateral view. Consider CT for further evaluation. - Swelling about the ankle  and dorsum of the foot.  CXR 08/31/21: FINDINGS: Lungs are adequately inflated without focal airspace consolidation. Subtle stable prominence of the interstitium. Mild stable cardiomegaly. Remainder of the exam is unchanged. IMPRESSION: 1. No acute cardiopulmonary disease. Mild stable diffuse interstitial prominence. 2. Mild stable cardiomegaly.   EKG:  11/26/21: Normal sinus rhythm Nonspecific T wave abnormality Prolonged QT Abnormal ECG When compared with ECG of 28-Aug-2021 03:44, No significant change since last tracing Confirmed by Oswaldo Milian [phone]) on 11/28/2021 9:50:53 PM   CV: POC US Echo 11/03/21 ([facility]): Findings:   Pericardium:  No significant pericardial effusion  LV function:  Normal (> 50% EF)  RV:  Normal  IVC collapsibility:  Normal  Interpretation:   Qualitatively normal left ventricular systolic function, No significant pericardial effusion   Echo 08/28/21: IMPRESSIONS   1. Left ventricular ejection fraction, by estimation, is 35 to 40%. The  left ventricle has moderately decreased function. The left ventricle  demonstrates global hypokinesis. There is moderate concentric left  ventricular hypertrophy. Left ventricular  diastolic function could not be evaluated.   2. Right ventricular systolic function is normal. The right ventricular  size is normal. There is mildly elevated pulmonary artery systolic  pressure. The estimated right ventricular systolic pressure is [id number] mmHg.   3. Left atrial size was severely dilated.   4. Right atrial size was mild to moderately dilated.   5. The mitral valve is normal in structure. Mild to moderate mitral valve  regurgitation. No evidence of mitral stenosis.   6. Tricuspid valve regurgitation is mild to moderate.   7. The aortic valve is tricuspid. Aortic valve regurgitation is not  visualized. Aortic valve sclerosis/calcification is present, without any  evidence of aortic stenosis.   8. The inferior vena cava is normal in size with greater than 50%  respiratory variability, suggesting right atrial pressure of 3 mmHg.    Past Medical History:  Diagnosis Date   CHF (congestive heart failure) (HCC)    Gestational diabetes    Hypertension    Illicit drug use    MDD (major depressive disorder)    Mental disorder    PE (pulmonary thromboembolism) ([place])  08/27/2021   [facility] CTA chest 08/27/21: Very small subsegmental left upper lobe pulmonary embolus   Polysubstance abuse ([place])    Postpartum cardiomyopathy     Past Surgical History:  Procedure Laterality Date   NO PAST SURGERIES      MEDICATIONS: No current facility-administered medications for this encounter.    apixaban (ELIQUIS) 5 MG TABS tablet   Apixaban Starter Pack, 90m and 560m (ELIQUIS DVT/PE STARTER PACK)   carvedilol (COREG) 6.25 MG tablet   dapagliflozin propanediol (FARXIGA) 10 MG TABS tablet   ferrous sulfate 325 (65 FE) MG tablet   furosemide (LASIX) 40 MG tablet   sacubitril-valsartan (ENTRESTO) 24-26 MG   spironolactone (ALDACTONE) 25 MG tablet    AlMyra GianottiPA-C Surgical Short Stay/Anesthesiology [facility] ([phone] ([phone]/15/2024 5:58 PM

## 2022-05-01 NOTE — Progress Notes (Signed)
Office Visit Note   Patient: Laura Wagner           Date of Birth: [birth year]           MRN: [id number] Visit Date: 05/01/2022              Requested by: No referring provider defined for this encounter. PCP: Patient, No Pcp Per  Chief Complaint  Patient presents with   Right Ankle - Fracture      HPI: Patient is a 27 year old woman who is seen for initial evaluation for comminuted displaced right ankle pilon fracture with intra-articular extension.  Patient states that injury occurred on January 28 when she slammed her foot in the car door accidentally.  She went to urgent care in [place] on February 2.  Referral to orthopedics in [place] recommended referral to [facility].  Patient states she has been nonweightbearing in a posterior splint.  Patient reports a history of heart failure secondary to drugs and pregnancy.  Patient states that she has been on Eliquis but has not been taking it recently.  Patient is a smoker.  Assessment & Plan: Visit Diagnoses:  1. Pain in right ankle and joints of right foot   2. Closed displaced pilon fracture of right tibia, initial encounter   3. Displaced fracture of lateral malleolus of right fibula, initial encounter for closed fracture     Plan: Discussed recommendation to proceed with open reduction internal fixation of the fibula and pilon fracture.  Discussed risk of traumatic arthritis with the intra-articular extension of the fracture.  Patient is nontender to palpation across the Lisfranc complex and despite radiographic evidence of an avulsion fracture will not pursue fixation of the foot at this time.  This may need to be addressed later.  Discussed the importance of smoking cessation.  Discussed risk of wound dehiscence and potential amputation with persistent smoking.  Also recommended aspirin once a day postoperatively to minimize risks of DVT.  Discussed risk of infection neurovascular injury arthritic pain need for  additional surgery.  Follow-Up Instructions: Return in about 1 week (around 05/08/2022).   Ortho Exam  Patient is alert, oriented, no adenopathy, well-dressed, normal affect, normal respiratory effort. Examination patient has swelling of the foot and ankle and valgus deformity.  I cannot palpate a dorsalis pedis or posterior tibial pulse.  The Doppler was used and patient has a dampened biphasic dorsalis pedis and posterior tibial pulse.  Patient does have minimal movement of her toes.  Patient has no pain with distraction across the Lisfranc complex or palpation directly over the Lisfranc complex.  There are no open wounds no cellulitis no fracture blisters.  Imaging: XR Ankle Complete Right  Result Date: 05/01/2022 Three-view radiographs of the right ankle shows a comminuted Weber C fibula fracture with a comminuted pilon fracture with intra-articular extension.  The medial malleolus is intact.  There is a small avulsion off the lateral process of the talus.  No images are attached to the encounter.  Labs: Lab Results  Component Value Date   REPTSTATUS 08/30/2021 FINAL 08/29/2021   CULT (A) 08/29/2021    <10,000 COLONIES/mL INSIGNIFICANT GROWTH Performed at [facility] [address] [place], [place] [address]    LABORGA ESCHERICHIA COLI (A) 06/25/2016     Lab Results  Component Value Date   ALBUMIN 2.7 (L) 08/31/2021   ALBUMIN 2.6 (L) 08/29/2021   ALBUMIN 3.2 (L) 08/28/2021    No results found for: "MG" No results found  for: "VD25OH"  No results found for: "PREALBUMIN"    Latest Ref Rng & Units 08/31/2021    5:49 AM 08/30/2021    3:32 AM 08/29/2021    7:12 AM  CBC EXTENDED  WBC 4.0 - 10.5 K/uL 9.3  11.6  11.2   RBC 3.87 - 5.11 MIL/uL 3.53  3.32  3.30   Hemoglobin 12.0 - 15.0 g/dL 8.0  7.6  7.7   HCT 36.0 - 46.0 % 28.2  26.8  26.3   Platelets 150 - 400 K/uL 481  466  427      There is no height or weight on file to calculate BMI.  Orders:  Orders Placed  This Encounter  Procedures   XR Ankle Complete Right   XR Foot Complete Right   No orders of the defined types were placed in this encounter.    Procedures: No procedures performed  Clinical Data: No additional findings.  ROS:  All other systems negative, except as noted in the HPI. Review of Systems  Objective: Vital Signs: LMP  (LMP Unknown) Comment: "last month"  Specialty Comments:  No specialty comments available.  PMFS History: Patient Active Problem List   Diagnosis Date Noted   Acute HFrEF (heart failure with reduced ejection fraction) /postpartum cardiomyopathy with systolic dysfunction CHF 99991111   CHF (congestive heart failure) ([place]) 08/28/2021   Transaminitis 08/28/2021   Acute pulmonary embolism ([place]) 08/28/2021   Hypoalbuminemia 08/28/2021   Acute on chronic and deficiency anemia/postpartum related 08/28/2021   Gestational diabetes 06/05/2021   Asymptomatic bacteriuria 05/20/2021   Late prenatal care in second trimester 05/16/2021   Supervision of high-risk pregnancy 05/13/2021   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   MDD (major depressive disorder), severe ([place]) 03/25/2018   Altered mental status 03/23/2018   Polysubstance abuse/including methamphetamine 03/23/2018   Essential hypertension 01/05/2017   Hx of preeclampsia, prior pregnancy, currently pregnant 12/25/2016   Tobacco use disorder 07/30/2016   Past Medical History:  Diagnosis Date   CHF (congestive heart failure) ([place])    Gestational diabetes    Hypertension    Illicit drug use    MDD (major depressive disorder)    Mental disorder    Polysubstance abuse ([place])    Postpartum cardiomyopathy     Family History  Problem Relation Age of Onset   Asthma Brother    Cancer Maternal Grandmother        breast    Past Surgical History:  Procedure Laterality Date   NO PAST SURGERIES     Social History   Occupational History   Not on file  Tobacco Use   Smoking  status: Every Day    Packs/day: 0.50    Years: 9.00    Total pack years: 4.50    Types: Cigarettes   Smokeless tobacco: Never  Vaping Use   Vaping Use: Never used  Substance and Sexual Activity   Alcohol use: No   Drug use: Yes    Types: Marijuana, Methamphetamines   Sexual activity: Yes    Birth control/protection: None

## 2022-05-01 NOTE — Anesthesia Preprocedure Evaluation (Deleted)
Anesthesia Evaluation    Airway        Dental   Pulmonary Current Smoker          Cardiovascular hypertension,      Neuro/Psych    GI/Hepatic   Endo/Other  diabetes    Renal/GU      Musculoskeletal   Abdominal   Peds  Hematology   Anesthesia Other Findings   Reproductive/Obstetrics                             Anesthesia Physical Anesthesia Plan  ASA:   Anesthesia Plan:    Post-op Pain Management:    Induction:   PONV Risk Score and Plan:   Airway Management Planned:   Additional Equipment:   Intra-op Plan:   Post-operative Plan:   Informed Consent:   Plan Discussed with:   Anesthesia Plan Comments: (See PAT note written 05/01/2022 by Myra Gianotti, PA-C.  )       Anesthesia Quick Evaluation

## 2022-05-01 NOTE — Progress Notes (Addendum)
Laura Wagner denies chest pain or shortness of breath. Patient denies having any s/s of Covid in her household, also denies any known exposure to Covid.   Laura Wagner has been treated for CHF and PE- patient has been out of medications for over 6 months. Laura Wagner reports that she has not had a way to get to the Dr.  [name] has seen Dr. Ottie Glazier in the HF clinic.  Patient does not have a PCP.

## 2022-05-02 NOTE — H&P (View-Only) (Signed)
Laura Wagner is an 27 y.o. female.   Chief Complaint: Right ankle pain and deformity. HPI: Patient is a 27 year old woman who is seen for initial evaluation for comminuted displaced right ankle pilon fracture with intra-articular extension. Patient states that injury occurred on January 28 when she slammed her foot in the car door accidentally. She went to urgent care in [place] on February 2. Referral to orthopedics in [place] recommended referral to [facility]. Patient states she has been nonweightbearing in a posterior splint. Patient reports a history of heart failure secondary to drugs and pregnancy. Patient states that she has been on Eliquis but has not been taking it recently. Patient is a smoker.   Patient was a no-show for her initial proposed surgery.  Patient presents at this time essentially 4 weeks status postinitial fracture.  Past Medical History:  Diagnosis Date   CHF (congestive heart failure) (HCC)    Headache    Hypertension    Illicit drug use    MDD (major depressive disorder)    Mental disorder    PE (pulmonary thromboembolism) (Laura Wagner) 08/27/2021   [facility] CTA chest 08/27/21: Very small subsegmental left upper lobe pulmonary embolus   Polysubstance abuse ([place])    Postpartum cardiomyopathy     Past Surgical History:  Procedure Laterality Date   NO PAST SURGERIES      Family History  Problem Relation Age of Onset   Asthma Brother    Cancer Maternal Grandmother        breast   Social History:  reports that she has been smoking cigarettes. She has a 4.50 pack-year smoking history. She has never used smokeless tobacco. She reports current drug use. Drugs: Marijuana and Methamphetamines. She reports that she does not drink alcohol.  Allergies: No Known Allergies  No medications prior to admission.    No results found for this or any previous visit (from the past 48 hour(s)). XR Foot Complete Right  Result Date: 05/01/2022 Three-view radiographs of the  right foot does not show the avulsion fracture dorsally that could be seen on the initial radiographs.  There is no displacement across the Lisfranc complex.  XR Ankle Complete Right  Result Date: 05/01/2022 Three-view radiographs of the right ankle shows a comminuted Weber C fibula fracture with a comminuted pilon fracture with intra-articular extension.  The medial malleolus is intact.  There is a small avulsion off the lateral process of the talus.   Review of Systems  All other systems reviewed and are negative.   Last menstrual period 04/26/2022, unknown if currently breastfeeding. Physical Exam  Patient is alert, oriented, no adenopathy, well-dressed, normal affect, normal respiratory effort. Examination patient has swelling of the foot and ankle and valgus deformity.  I cannot palpate a dorsalis pedis or posterior tibial pulse.  The Doppler was used and patient has a dampened biphasic dorsalis pedis and posterior tibial pulse.  Patient does have minimal movement of her toes.  Patient has no pain with distraction across the Lisfranc complex or palpation directly over the Lisfranc complex.  There are no open wounds no cellulitis no fracture blisters. Assessment/Plan 1. Pain in right ankle and joints of right foot   2. Closed displaced pilon fracture of right tibia, initial encounter   3. Displaced fracture of lateral malleolus of right fibula, initial encounter for closed fracture       Plan: Discussed recommendation to proceed with open reduction internal fixation of the fibula and pilon fracture.  Discussed risk of traumatic arthritis  with the intra-articular extension of the fracture.  Patient is nontender to palpation across the Lisfranc complex and despite radiographic evidence of an avulsion fracture will not pursue fixation of the foot at this time.  This may need to be addressed later.  Discussed the importance of smoking cessation.  Discussed risk of wound dehiscence and potential  amputation with persistent smoking.  Also recommended aspirin once a day postoperatively to minimize risks of DVT.  Discussed risk of infection neurovascular injury arthritic pain need for additional surgery.  With patient's delay in surgery discussed she has an increased risk of not obtaining good alignment increased risk of arthritis and pain increased risk of need for additional surgery and possible fusion.  Newt Minion, MD 05/02/2022, 6:52 AM

## 2022-05-02 NOTE — H&P (Addendum)
Laura Wagner is an 27 y.o. female.   Chief Complaint: Right ankle pain and deformity. HPI: Patient is a 27 year old woman who is seen for initial evaluation for comminuted displaced right ankle pilon fracture with intra-articular extension. Patient states that injury occurred on January 28 when she slammed her foot in the car door accidentally. She went to urgent care in [place] on February 2. Referral to orthopedics in [place] recommended referral to [facility]. Patient states she has been nonweightbearing in a posterior splint. Patient reports a history of heart failure secondary to drugs and pregnancy. Patient states that she has been on Eliquis but has not been taking it recently. Patient is a smoker.   Patient was a no-show for her initial proposed surgery.  Patient presents at this time essentially 4 weeks status postinitial fracture.  Past Medical History:  Diagnosis Date   CHF (congestive heart failure) (HCC)    Headache    Hypertension    Illicit drug use    MDD (major depressive disorder)    Mental disorder    PE (pulmonary thromboembolism) ([place]) 08/27/2021   [facility] CT[facility] chest 08/27/21: Very small subsegmental left upper lobe pulmonary embolus   Polysubstance abuse ([place])    Postpartum cardiomyopathy     Past Surgical History:  Procedure Laterality Date   NO PAST SURGERIES      Family History  Problem Relation Age of Onset   Asthma Brother    Cancer Maternal Grandmother        breast   Social History:  reports that she has been smoking cigarettes. She has a 4.50 pack-year smoking history. She has never used smokeless tobacco. She reports current drug use. Drugs: Marijuana and Methamphetamines. She reports that she does not drink alcohol.  Allergies: No Known Allergies  No medications prior to admission.    No results found for this or any previous visit (from the past 48 hour(s)). XR Foot Complete Right  Result Date: 05/01/2022 Three-view radiographs of the  right foot does not show the avulsion fracture dorsally that could be seen on the initial radiographs.  There is no displacement across the Lisfranc complex.  XR Ankle Complete Right  Result Date: 05/01/2022 Three-view radiographs of the right ankle shows a comminuted Weber C fibula fracture with a comminuted pilon fracture with intra-articular extension.  The medial malleolus is intact.  There is a small avulsion off the lateral process of the talus.   Review of Systems  All other systems reviewed and are negative.   Last menstrual period 04/26/2022, unknown if currently breastfeeding. Physical Exam  Patient is alert, oriented, no adenopathy, well-dressed, normal affect, normal respiratory effort. Examination patient has swelling of the foot and ankle and valgus deformity.  I cannot palpate a dorsalis pedis or posterior tibial pulse.  The Doppler was used and patient has a dampened biphasic dorsalis pedis and posterior tibial pulse.  Patient does have minimal movement of her toes.  Patient has no pain with distraction across the Lisfranc complex or palpation directly over the Lisfranc complex.  There are no open wounds no cellulitis no fracture blisters. Assessment/Plan 1. Pain in right ankle and joints of right foot   2. Closed displaced pilon fracture of right tibia, initial encounter   3. Displaced fracture of lateral malleolus of right fibula, initial encounter for closed fracture       Plan: Discussed recommendation to proceed with open reduction internal fixation of the fibula and pilon fracture.  Discussed risk of traumatic arthritis  with the intra-articular extension of the fracture.  Patient is nontender to palpation across the Lisfranc complex and despite radiographic evidence of an avulsion fracture will not pursue fixation of the foot at this time.  This may need to be addressed later.  Discussed the importance of smoking cessation.  Discussed risk of wound dehiscence and potential  amputation with persistent smoking.  Also recommended aspirin once a day postoperatively to minimize risks of DVT.  Discussed risk of infection neurovascular injury arthritic pain need for additional surgery.  With patient's delay in surgery discussed she has an increased risk of not obtaining good alignment increased risk of arthritis and pain increased risk of need for additional surgery and possible fusion.  Newt Minion, MD 05/02/2022, 6:52 AM

## 2022-05-03 ENCOUNTER — Ambulatory Visit (HOSPITAL_COMMUNITY): Admission: RE | Admit: 2022-05-03 | Payer: Medicaid Other | Source: Home / Self Care | Admitting: Orthopedic Surgery

## 2022-05-03 HISTORY — DX: Headache, unspecified: R51.9

## 2022-05-03 SURGERY — OPEN REDUCTION INTERNAL FIXATION (ORIF) ANKLE FRACTURE
Anesthesia: Choice | Site: Ankle | Laterality: Right

## 2022-05-08 ENCOUNTER — Encounter (HOSPITAL_COMMUNITY): Payer: Self-pay | Admitting: Orthopedic Surgery

## 2022-05-08 ENCOUNTER — Other Ambulatory Visit: Payer: Self-pay

## 2022-05-08 NOTE — Progress Notes (Signed)
Laura Wagner denies chest pain or shortness of breath.  .Patient denies having any s/s of Covid in her household, also denies any known exposure to Covid.   [name] did not have transportation to get to hospital for surgery last week. [name] is not taking any medications, because she has not been able to get a ride to the cardiologist office- [facility].  Patient 's PCP is Dr.A Daniel Nones.

## 2022-05-09 ENCOUNTER — Encounter: Payer: Medicaid Other | Admitting: Family

## 2022-05-09 ENCOUNTER — Ambulatory Visit (HOSPITAL_COMMUNITY)
Admission: RE | Admit: 2022-05-09 | Discharge: 2022-05-09 | Disposition: A | Discharge status: Home / Self Care | Payer: Medicaid Other | Attending: Orthopedic Surgery | Admitting: Orthopedic Surgery

## 2022-05-09 ENCOUNTER — Other Ambulatory Visit: Payer: Self-pay

## 2022-05-09 ENCOUNTER — Encounter (HOSPITAL_COMMUNITY)
Admission: RE | Disposition: A | Discharge status: Home / Self Care | Payer: Self-pay | Source: Home / Self Care | Attending: Orthopedic Surgery

## 2022-05-09 ENCOUNTER — Ambulatory Visit (HOSPITAL_COMMUNITY): Payer: Medicaid Other

## 2022-05-09 ENCOUNTER — Encounter (HOSPITAL_COMMUNITY): Payer: Self-pay | Admitting: Orthopedic Surgery

## 2022-05-09 ENCOUNTER — Ambulatory Visit (HOSPITAL_BASED_OUTPATIENT_CLINIC_OR_DEPARTMENT_OTHER): Payer: Medicaid Other | Admitting: Vascular Surgery

## 2022-05-09 ENCOUNTER — Ambulatory Visit (HOSPITAL_COMMUNITY): Payer: Medicaid Other | Admitting: Vascular Surgery

## 2022-05-09 DIAGNOSIS — I110 Hypertensive heart disease with heart failure: Secondary | ICD-10-CM

## 2022-05-09 DIAGNOSIS — F17210 Nicotine dependence, cigarettes, uncomplicated: Secondary | ICD-10-CM | POA: Diagnosis not present

## 2022-05-09 DIAGNOSIS — S82491A Other fracture of shaft of right fibula, initial encounter for closed fracture: Secondary | ICD-10-CM | POA: Diagnosis not present

## 2022-05-09 DIAGNOSIS — S82871A Displaced pilon fracture of right tibia, initial encounter for closed fracture: Secondary | ICD-10-CM

## 2022-05-09 DIAGNOSIS — I509 Heart failure, unspecified: Secondary | ICD-10-CM | POA: Diagnosis not present

## 2022-05-09 DIAGNOSIS — W230XXA Caught, crushed, jammed, or pinched between moving objects, initial encounter: Secondary | ICD-10-CM | POA: Diagnosis not present

## 2022-05-09 DIAGNOSIS — S82831A Other fracture of upper and lower end of right fibula, initial encounter for closed fracture: Secondary | ICD-10-CM | POA: Diagnosis not present

## 2022-05-09 HISTORY — PX: ORIF ANKLE FRACTURE: SHX5408

## 2022-05-09 LAB — COMPREHENSIVE METABOLIC PANEL
ALT: 17 U/L (ref 0–44)
AST: 20 U/L (ref 15–41)
Albumin: 3.2 g/dL — ABNORMAL LOW (ref 3.5–5.0)
Alkaline Phosphatase: 129 U/L — ABNORMAL HIGH (ref 38–126)
Anion gap: 9 (ref 5–15)
BUN: 8 mg/dL (ref 6–20)
CO2: 25 mmol/L (ref 22–32)
Calcium: 8.9 mg/dL (ref 8.9–10.3)
Chloride: 99 mmol/L (ref 98–111)
Creatinine, Ser: 0.86 mg/dL (ref 0.44–1.00)
GFR, Estimated: >60 mL/min (ref >60)
Glucose, Bld: 101 mg/dL — ABNORMAL HIGH (ref 70–99)
Potassium: 3.7 mmol/L (ref 3.5–5.1)
Sodium: 133 mmol/L — ABNORMAL LOW (ref 135–145)
Total Bilirubin: 0.7 mg/dL (ref 0.3–1.2)
Total Protein: 6.8 g/dL (ref 6.5–8.1)

## 2022-05-09 LAB — CBC
HCT: 39.5 % (ref 36.0–46.0)
Hemoglobin: 12.5 g/dL (ref 12.0–15.0)
MCH: 26.7 pg (ref 26.0–34.0)
MCHC: 31.6 g/dL (ref 30.0–36.0)
MCV: 84.4 fL (ref 80.0–100.0)
Platelets: 439 10*3/uL — ABNORMAL HIGH (ref 150–400)
RBC: 4.68 MIL/uL (ref 3.87–5.11)
RDW: 14.5 % (ref 11.5–15.5)
WBC: 9.4 10*3/uL (ref 4.0–10.5)
nRBC: 0 % (ref 0.0–0.2)

## 2022-05-09 SURGERY — OPEN REDUCTION INTERNAL FIXATION (ORIF) ANKLE FRACTURE
Anesthesia: General | Site: Ankle | Laterality: Right

## 2022-05-09 MED ORDER — FENTANYL CITRATE (PF) 100 MCG/2ML IJ SOLN
INTRAMUSCULAR | Status: AC
  Administered 2022-05-09: 50 ug via INTRAVENOUS
  Filled 2022-05-09: qty 2

## 2022-05-09 MED ORDER — OXYCODONE-ACETAMINOPHEN 5-325 MG PO TABS: 1.0000 | ORAL_TABLET | ORAL | 0 refills | Status: DC | PRN

## 2022-05-09 MED ORDER — BUPIVACAINE LIPOSOME 1.3 % IJ SUSP
INTRAMUSCULAR | Status: AC
  Filled 2022-05-09: qty 10

## 2022-05-09 MED ORDER — ACETAMINOPHEN 160 MG/5ML PO SOLN: 325.0000 mg | ORAL | Status: DC | PRN

## 2022-05-09 MED ORDER — FENTANYL CITRATE (PF) 250 MCG/5ML IJ SOLN
INTRAMUSCULAR | Status: AC
  Filled 2022-05-09: qty 5

## 2022-05-09 MED ORDER — CHLORHEXIDINE GLUCONATE 0.12 % MT SOLN
15.0000 mL | Freq: Once | OROMUCOSAL | Status: AC
  Administered 2022-05-09: 15 mL via OROMUCOSAL
  Filled 2022-05-09: qty 15

## 2022-05-09 MED ORDER — MIDAZOLAM HCL 2 MG/2ML IJ SOLN: 1.0000 mg | Freq: Once | INTRAMUSCULAR | Status: AC

## 2022-05-09 MED ORDER — MIDAZOLAM HCL 2 MG/2ML IJ SOLN
INTRAMUSCULAR | Status: AC
  Filled 2022-05-09: qty 2

## 2022-05-09 MED ORDER — ACETAMINOPHEN 325 MG PO TABS: 325.0000 mg | ORAL_TABLET | ORAL | Status: DC | PRN

## 2022-05-09 MED ORDER — PHENYLEPHRINE 80 MCG/ML (10ML) SYRINGE FOR IV PUSH (FOR BLOOD PRESSURE SUPPORT)
PREFILLED_SYRINGE | INTRAVENOUS | Status: DC | PRN
  Administered 2022-05-09: 160 ug via INTRAVENOUS
  Administered 2022-05-09 (×2): 80 ug via INTRAVENOUS
  Administered 2022-05-09: 160 ug via INTRAVENOUS

## 2022-05-09 MED ORDER — ORAL CARE MOUTH RINSE: 15.0000 mL | Freq: Once | OROMUCOSAL | Status: AC

## 2022-05-09 MED ORDER — BUPIVACAINE-EPINEPHRINE (PF) 0.5% -1:200000 IJ SOLN
INTRAMUSCULAR | Status: DC | PRN
  Administered 2022-05-09: 15 mL via PERINEURAL
  Administered 2022-05-09: 12 mL via PERINEURAL

## 2022-05-09 MED ORDER — PROPOFOL 10 MG/ML IV BOLUS
INTRAVENOUS | Status: DC | PRN
  Administered 2022-05-09: 40 mg via INTRAVENOUS
  Administered 2022-05-09: 110 mg via INTRAVENOUS
  Administered 2022-05-09: 50 mg via INTRAVENOUS

## 2022-05-09 MED ORDER — DEXAMETHASONE SODIUM PHOSPHATE 10 MG/ML IJ SOLN
INTRAMUSCULAR | Status: DC | PRN
  Administered 2022-05-09: 5 mg via INTRAVENOUS

## 2022-05-09 MED ORDER — LIDOCAINE 2% (20 MG/ML) 5 ML SYRINGE
INTRAMUSCULAR | Status: DC | PRN
  Administered 2022-05-09: 60 mg via INTRAVENOUS

## 2022-05-09 MED ORDER — OXYCODONE HCL 5 MG PO TABS: 5.0000 mg | ORAL_TABLET | Freq: Once | ORAL | Status: DC | PRN

## 2022-05-09 MED ORDER — MEPERIDINE HCL 25 MG/ML IJ SOLN: 6.2500 mg | INTRAMUSCULAR | Status: DC | PRN

## 2022-05-09 MED ORDER — CEFAZOLIN SODIUM-DEXTROSE 2-4 GM/100ML-% IV SOLN
2.0000 g | INTRAVENOUS | Status: AC
  Administered 2022-05-09: 2 g via INTRAVENOUS
  Filled 2022-05-09: qty 100

## 2022-05-09 MED ORDER — ASPIRIN 325 MG PO TBEC: 325.0000 mg | DELAYED_RELEASE_TABLET | Freq: Every day | ORAL | 0 refills | Status: DC

## 2022-05-09 MED ORDER — 0.9 % SODIUM CHLORIDE (POUR BTL) OPTIME
TOPICAL | Status: DC | PRN
  Administered 2022-05-09: 1000 mL

## 2022-05-09 MED ORDER — OXYCODONE HCL 5 MG/5ML PO SOLN: 5.0000 mg | Freq: Once | ORAL | Status: DC | PRN

## 2022-05-09 MED ORDER — ONDANSETRON HCL 4 MG/2ML IJ SOLN
INTRAMUSCULAR | Status: DC | PRN
  Administered 2022-05-09: 4 mg via INTRAVENOUS

## 2022-05-09 MED ORDER — FENTANYL CITRATE (PF) 100 MCG/2ML IJ SOLN: 50.0000 ug | Freq: Once | INTRAMUSCULAR | Status: AC

## 2022-05-09 MED ORDER — ONDANSETRON HCL 4 MG/2ML IJ SOLN: 4.0000 mg | Freq: Once | INTRAMUSCULAR | Status: DC | PRN

## 2022-05-09 MED ORDER — BUPIVACAINE LIPOSOME 1.3 % IJ SUSP
INTRAMUSCULAR | Status: DC | PRN
  Administered 2022-05-09: 10 mL via PERINEURAL

## 2022-05-09 MED ORDER — FENTANYL CITRATE (PF) 100 MCG/2ML IJ SOLN: 25.0000 ug | INTRAMUSCULAR | Status: DC | PRN

## 2022-05-09 MED ORDER — MIDAZOLAM HCL 2 MG/2ML IJ SOLN
INTRAMUSCULAR | Status: AC
  Administered 2022-05-09: 1 mg via INTRAVENOUS
  Filled 2022-05-09: qty 2

## 2022-05-09 MED ORDER — LACTATED RINGERS IV SOLN: INTRAVENOUS | Status: DC

## 2022-05-09 SURGICAL SUPPLY — 50 items
BAG COUNTER SPONGE SURGICOUNT (BAG) ×1 IMPLANT
BANDAGE ESMARK 6X9 LF (GAUZE/BANDAGES/DRESSINGS) IMPLANT
BIT DRILL 2.5X2.75 QC CALB (BIT) IMPLANT
BNDG COHESIVE 4X5 TAN STRL (GAUZE/BANDAGES/DRESSINGS) ×1 IMPLANT
BNDG COHESIVE 4X5 TAN STRL LF (GAUZE/BANDAGES/DRESSINGS) IMPLANT
BNDG COHESIVE 6X5 TAN NS LF (GAUZE/BANDAGES/DRESSINGS) IMPLANT
BNDG ESMARK 6X9 LF (GAUZE/BANDAGES/DRESSINGS)
BNDG GAUZE DERMACEA FLUFF 4 (GAUZE/BANDAGES/DRESSINGS) ×1 IMPLANT
COVER SURGICAL LIGHT HANDLE (MISCELLANEOUS) ×1 IMPLANT
DRAPE OEC MINIVIEW 54X84 (DRAPES) IMPLANT
DRAPE U-SHAPE 47X51 STRL (DRAPES) ×1 IMPLANT
DRSG ADAPTIC 3X8 NADH LF (GAUZE/BANDAGES/DRESSINGS) ×1 IMPLANT
DURAPREP 26ML APPLICATOR (WOUND CARE) ×1 IMPLANT
ELECT REM PT RETURN 9FT ADLT (ELECTROSURGICAL) ×1
ELECTRODE REM PT RTRN 9FT ADLT (ELECTROSURGICAL) ×1 IMPLANT
GAUZE PAD ABD 8X10 STRL (GAUZE/BANDAGES/DRESSINGS) ×1 IMPLANT
GAUZE SPONGE 4X4 12PLY STRL (GAUZE/BANDAGES/DRESSINGS) ×1 IMPLANT
GLOVE BIOGEL PI IND STRL 9 (GLOVE) ×1 IMPLANT
GLOVE SURG ORTHO 9.0 STRL STRW (GLOVE) ×1 IMPLANT
GOWN STRL REUS W/ TWL XL LVL3 (GOWN DISPOSABLE) ×3 IMPLANT
GOWN STRL REUS W/TWL XL LVL3 (GOWN DISPOSABLE) ×3
K-WIRE ACE 1.6X6 (WIRE) ×1
KIT BASIN OR (CUSTOM PROCEDURE TRAY) ×1 IMPLANT
KIT TURNOVER KIT B (KITS) ×1 IMPLANT
KWIRE ACE 1.6X6 (WIRE) IMPLANT
MANIFOLD NEPTUNE II (INSTRUMENTS) ×1 IMPLANT
NS IRRIG 1000ML POUR BTL (IV SOLUTION) ×1 IMPLANT
PACK ORTHO EXTREMITY (CUSTOM PROCEDURE TRAY) ×1 IMPLANT
PAD ARMBOARD 7.5X6 YLW CONV (MISCELLANEOUS) ×2 IMPLANT
PLATE 6H RT DIST ANTLAT TIB (Plate) ×1 IMPLANT
PLATE ANTLAT CNTR NAR 114X6 (Plate) IMPLANT
PLATE TUB 100DEG 5 HO (Plate) IMPLANT
SCREW CORTICAL 3.5MM  12MM (Screw) ×4 IMPLANT
SCREW CORTICAL 3.5MM 12MM (Screw) IMPLANT
SCREW CORTICAL 3.5MM 24MM (Screw) IMPLANT
SCREW CORTICAL 3.5MM 26MM (Screw) IMPLANT
SCREW LOCK CORT STAR 3.5X24 (Screw) IMPLANT
SCREW LOCK CORT STAR 3.5X30 (Screw) IMPLANT
SCREW LOCK CORT STAR 3.5X34 (Screw) IMPLANT
SCREW T15 LP CORT 3.5X36MM NS (Screw) IMPLANT
SPONGE INTESTINAL PEANUT (DISPOSABLE) IMPLANT
STAPLER VISISTAT 35W (STAPLE) IMPLANT
SUCTION FRAZIER HANDLE 10FR (MISCELLANEOUS) ×1
SUCTION TUBE FRAZIER 10FR DISP (MISCELLANEOUS) ×1 IMPLANT
SUT ETHILON 2 0 PSLX (SUTURE) IMPLANT
SUT VIC AB 2-0 CT1 27 (SUTURE) ×1
SUT VIC AB 2-0 CT1 TAPERPNT 27 (SUTURE) ×1 IMPLANT
TOWEL GREEN STERILE (TOWEL DISPOSABLE) ×1 IMPLANT
TOWEL GREEN STERILE FF (TOWEL DISPOSABLE) ×1 IMPLANT
TUBE CONNECTING 12X1/4 (SUCTIONS) ×1 IMPLANT

## 2022-05-09 NOTE — Interval H&P Note (Signed)
History and Physical Interval Note:  05/09/2022 3:13 PM  [place]  has presented today for surgery, with the diagnosis of Right Pilon Fracture, Fibula Fracture, Lisfranc Fracture.  The various methods of treatment have been discussed with the patient and family. After consideration of risks, benefits and other options for treatment, the patient has consented to  Procedure(s): OPEN REDUCTION INTERNAL FIXATION RIGHT FIBULA  AND TIBIA (Right) as a surgical intervention.  The patient's history has been reviewed, patient examined, no change in status, stable for surgery.  I have reviewed the patient's chart and labs.  Questions were answered to the patient's satisfaction.     Laura Wagner

## 2022-05-09 NOTE — Anesthesia Procedure Notes (Signed)
Anesthesia Regional Block: Adductor canal block   Pre-Anesthetic Checklist: , timeout performed,  Correct Patient, Correct Site, Correct Laterality,  Correct Procedure, Correct Position, site marked,  Risks and benefits discussed,  Pre-op evaluation,  At surgeon's request and post-op pain management  Laterality: Right  Prep: Maximum Sterile Barrier Precautions used, chloraprep       Needles:  Injection technique: Single-shot  Needle Type: Echogenic Stimulator Needle     Needle Length: 9cm  Needle Gauge: 22     Additional Needles:   Procedures:,,,, ultrasound used (permanent image in chart),,    Narrative:  Start time: 05/09/2022 4:17 PM End time: 05/09/2022 4:20 PM Injection made incrementally with aspirations every 5 mL.  Performed by: Personally  Anesthesiologist: Brennan Bailey, MD  Additional Notes: Risks, benefits, and alternative discussed. Patient gave consent for procedure. Patient prepped and draped in sterile fashion. Sedation administered, patient remains easily responsive to voice. Relevant anatomy identified with ultrasound guidance. Local anesthetic given in 5cc increments with no signs or symptoms of intravascular injection. No pain or paraesthesias with injection. Patient monitored throughout procedure with signs of LAST or immediate complications. Tolerated well. Ultrasound image placed in chart.  Tawny Asal, MD

## 2022-05-09 NOTE — Progress Notes (Signed)
Orthopedic Tech Progress Note Patient Details:  Alayja Arjona Bezold 03-09-1996 [id number]  PACU RN called requesting a CAM WALKER BOOT for patient   Ortho Devices Type of Ortho Device: CAM walker Ortho Device/Splint Location: RLE Ortho Device/Splint Interventions: Ordered, Application, Adjustment   Post Interventions Patient Tolerated: Well Instructions Provided: Care of device  Janit Pagan 05/09/2022, 9:00 PM

## 2022-05-09 NOTE — Anesthesia Procedure Notes (Signed)
Anesthesia Regional Block: Popliteal block   Pre-Anesthetic Checklist: , timeout performed,  Correct Patient, Correct Site, Correct Laterality,  Correct Procedure, Correct Position, site marked,  Risks and benefits discussed,  Pre-op evaluation,  At surgeon's request and post-op pain management  Laterality: Right  Prep: Maximum Sterile Barrier Precautions used, chloraprep       Needles:  Injection technique: Single-shot  Needle Type: Echogenic Stimulator Needle     Needle Length: 9cm  Needle Gauge: 22     Additional Needles:   Procedures:,,,, ultrasound used (permanent image in chart),,    Narrative:  Start time: 05/09/2022 4:20 PM End time: 05/09/2022 4:23 PM Injection made incrementally with aspirations every 5 mL.  Performed by: Personally  Anesthesiologist: Brennan Bailey, MD  Additional Notes: Risks, benefits, and alternative discussed. Patient gave consent for procedure. Patient prepped and draped in sterile fashion. Sedation administered, patient remains easily responsive to voice. Relevant anatomy identified with ultrasound guidance. Local anesthetic given in 5cc increments with no signs or symptoms of intravascular injection. No pain or paraesthesias with injection. Patient monitored throughout procedure with signs of LAST or immediate complications. Tolerated well. Ultrasound image placed in chart.  Tawny Asal, MD

## 2022-05-09 NOTE — Op Note (Signed)
05/09/2022  7:10 PM  PATIENT:  Laura Wagner    PRE-OPERATIVE DIAGNOSIS:  Right Pilon Fracture, Weber C fibula Fracture,   POST-OPERATIVE DIAGNOSIS:  Same  PROCEDURE:  RIGHT OPEN REDUCTION INTERNAL FIXATION FIBULA  AND OPEN REDUCTION INTERNAL FIXATION PILON TIBIA fracture. C-arm fluoroscopy to verify reduction.  SURGEON:  Newt Minion, MD  PHYSICIAN ASSISTANT:None ANESTHESIA:   General  PREOPERATIVE INDICATIONS:  Laura Wagner is a  27 y.o. female with a diagnosis of Right Pilon Fracture, Fibula Fracture, Lisfranc Fracture who failed conservative measures and elected for surgical management.    The risks benefits and alternatives were discussed with the patient preoperatively including but not limited to the risks of infection, bleeding, nerve injury, cardiopulmonary complications, the need for revision surgery, among others, and the patient was willing to proceed.  OPERATIVE IMPLANTS: Anterior lateral tibial plate and one third tubular fibular plate  '@ENCIMAGES'$ @  OPERATIVE FINDINGS: C-arm fluoroscopy verified reduction of the pilon fracture and reduction of the Weber C fibular fracture.  OPERATIVE PROCEDURE: Patient was brought the operating room and underwent a general anesthetic.  After adequate levels anesthesia were obtained patient's right lower extremity was prepped using DuraPrep draped into a sterile field a timeout was called.  A anterior lateral incision was made and this curved medially at the ankle.  Incision was made through the skin with a 21 blade knife.  Blunt dissection was then carried down to the superficial peroneal nerve.  This was protected and fasciotomies performed to release the nerve proximally.  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  A periosteal elevator and osteotome was used to take down the nonunion and have mobile and [place] allow for the fibula to be brought out to length.  The dissection was  then carried down on the tibia subperiosteal to protect the tendons and the anterior neurovascular bundle.  Again there was a fibrous malunion across the pilon fracture and subperiosteal dissection was used to take down this fibrous nonunion.  The plate was then placed distally with 1 compression and 2 locking screws into the distal tibia.  The plate was then used to reduce the shortened varus fracture and bring it out to length.  2 screws were placed adjacent to the plate to further bring the tibia out to length.  A total of 3 compression screws were placed proximally with 2 locking and 1 compression screw distally.  This brought the tibia out to length and in proper alignment.  The fibula was then brought out to length and a one third tubular plate with 3 2 screws proximally and 2 screws distally was used to stabilize the fibula.  C-arm fluoroscopy was used to verify the reduction.  The mortise was congruent.  The wound was irrigated with normal saline incision closed using 2-0 nylon a sterile dressing was applied.   DISCHARGE PLANNING:  Antibiotic duration: Preoperative antibiotics  Weightbearing: Nonweightbearing on the right  Pain medication: Prescription for Percocet  Dressing care/ Wound VAC: Dry dressing  Ambulatory devices: Crutches  Discharge to: Home.  Follow-up: In the office 1 week post operative.

## 2022-05-09 NOTE — Anesthesia Procedure Notes (Signed)
Procedure Name: LMA Insertion Date/Time: 05/09/2022 5:24 PM  Performed by: Rande Brunt, CRNAPre-anesthesia Checklist: Patient identified, Emergency Drugs available, Suction available and Patient being monitored Patient Re-evaluated:Patient Re-evaluated prior to induction Oxygen Delivery Method: Circle System Utilized Preoxygenation: Pre-oxygenation with 100% oxygen Induction Type: IV induction Ventilation: Mask ventilation without difficulty LMA: LMA inserted LMA Size: 3.0 Number of attempts: 1 Airway Equipment and Method: Bite block Placement Confirmation: positive ETCO2 Tube secured with: Tape Dental Injury: Teeth and Oropharynx as per pre-operative assessment

## 2022-05-09 NOTE — Transfer of Care (Signed)
Immediate Anesthesia Transfer of Care Note  Patient: Laura Wagner  Procedure(s) Performed: RIGHT OPEN REDUCTION INTERNAL FIXATION FIBULA  AND TIBIA (Right: Ankle)  Patient Location: PACU  Anesthesia Type:GA combined with regional for post-op pain  Level of Consciousness: drowsy and patient cooperative  Airway & Oxygen Therapy: Patient Spontanous Breathing  Post-op Assessment: Report given to RN, Post -op Vital signs reviewed and stable, and Patient moving all extremities  Post vital signs: Reviewed and stable  Last Vitals:  Vitals Value Taken Time  BP 147/112 05/09/22 1901  Temp    Pulse 92 05/09/22 1903  Resp 11 05/09/22 1903  SpO2 99 % 05/09/22 1903  Vitals shown include unvalidated device data.  Last Pain:  Vitals:   05/09/22 1521  TempSrc: Oral         Complications: No notable events documented.
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
## 2022-05-10 NOTE — Progress Notes (Addendum)
Patient's significant other Jeneen Rinks called the PACU at 0300 concerned about bloody drainage on patient's dressing.  I had Jeneen Rinks send pictures to my cell phone.  The pictures showed minimal drainage on the outer aspect of right leg and and minimal drainage at the ankle.  The dressing was in no way saturated.  I told Jeneen Rinks and Ms. Cuny that this is not uncommon and it did not appear to be a significant amount of drainage.  I informed Jeneen Rinks that this was just my opinion and if he felt the need to discuss further he could call the MD's number provided in the discharge instructions.  All questions were answered to the best of my ability and Jeneen Rinks and Ms. Tenpenny were appreciative of the information.

## 2022-05-10 NOTE — Anesthesia Postprocedure Evaluation (Signed)
Anesthesia Post Note  Patient: Laura Wagner  Procedure(s) Performed: RIGHT OPEN REDUCTION INTERNAL FIXATION FIBULA  AND TIBIA (Right: Ankle)     Patient location during evaluation: PACU Anesthesia Type: General Level of consciousness: awake and alert Pain management: pain level controlled Vital Signs Assessment: post-procedure vital signs reviewed and stable Respiratory status: spontaneous breathing, nonlabored ventilation, respiratory function stable and patient connected to nasal cannula oxygen Cardiovascular status: blood pressure returned to baseline and stable Postop Assessment: no apparent nausea or vomiting Anesthetic complications: no  No notable events documented.  Last Vitals:  Vitals:   05/09/22 1928 05/09/22 1930  BP: (!) 146/109   Pulse: 90 91  Resp: 14 16  Temp: 36.7 C   SpO2: 99% 99%    Last Pain:  Vitals:   05/09/22 1915  TempSrc:   PainSc: 0-No pain                 Effie Berkshire

## 2022-05-13 ENCOUNTER — Encounter (HOSPITAL_COMMUNITY): Payer: Self-pay | Admitting: Orthopedic Surgery

## 2022-05-15 ENCOUNTER — Encounter: Payer: Self-pay | Admitting: Radiology

## 2022-05-16 ENCOUNTER — Ambulatory Visit (INDEPENDENT_AMBULATORY_CARE_PROVIDER_SITE_OTHER): Payer: Medicaid Other | Admitting: Family

## 2022-05-16 ENCOUNTER — Telehealth: Payer: Self-pay | Admitting: Family

## 2022-05-16 DIAGNOSIS — S82871A Displaced pilon fracture of right tibia, initial encounter for closed fracture: Secondary | ICD-10-CM

## 2022-05-16 MED ORDER — OXYCODONE-ACETAMINOPHEN 5-325 MG PO TABS: 1.0000 | ORAL_TABLET | ORAL | 0 refills | Status: DC | PRN

## 2022-05-16 NOTE — Telephone Encounter (Signed)
I called patient and advised, Laura Wagner has sent medication to pharmacy.

## 2022-05-16 NOTE — Telephone Encounter (Signed)
Patient called advised the Rx for Oxycodone is not showing received at the pharmacy. Patient asked for a call back when Rx is sent in. The number to contact patient is [phone]

## 2022-05-21 ENCOUNTER — Encounter: Payer: Self-pay | Admitting: Family

## 2022-05-21 ENCOUNTER — Encounter: Payer: Medicaid Other | Admitting: Family

## 2022-05-21 NOTE — Progress Notes (Signed)
Post-Op Visit Note   Patient: Laura Wagner           Date of Birth: 1995-06-29           MRN: [id number] Visit Date: 05/16/2022 PCP: Patient, No Pcp Per  Chief Complaint:  Chief Complaint  Patient presents with   Right Ankle - Routine Post Op    05/09/22 right ORIF fib/tib    HPI:  HPI The patient is a 27 year old woman who is seen status post right pilon fracture with open reduction internal fixation. Ortho Exam On examination of the right ankle her incisions well-approximated with sutures there is no gaping drainage does have significant edema.  No erythema no cellulitis no blisters  Visit Diagnoses:  1. Closed displaced pilon fracture of right tibia, initial encounter     Plan: Discussed proper wear of the cam walker with foot at 90 degrees.  Discussed elevation for swelling compression with Ace wrap offered compression garments we will hold off at this time may use ice discussed using ibuprofen in between pain medication she will follow-up in 1 week  Follow-Up Instructions: No follow-ups on file.   Imaging: No results found.  Orders:  No orders of the defined types were placed in this encounter.  Meds ordered this encounter  Medications   oxyCODONE-acetaminophen (PERCOCET/ROXICET) 5-325 MG tablet    Sig: Take 1-2 tablets by mouth every 4 (four) hours as needed.    Dispense:  42 tablet    Refill:  0     PMFS History: Patient Active Problem List   Diagnosis Date Noted   Closed fracture of right distal fibula 05/09/2022   Displaced pilon fracture of right tibia, initial encounter for closed fracture 05/09/2022   Acute HFrEF (heart failure with reduced ejection fraction) /postpartum cardiomyopathy with systolic dysfunction CHF [id number]   CHF (congestive heart failure) ([place]) 08/28/2021   Transaminitis 08/28/2021   Acute pulmonary embolism ([place]) 08/28/2021   Hypoalbuminemia 08/28/2021   Acute on chronic and deficiency anemia/postpartum related  08/28/2021   Gestational diabetes 06/05/2021   Asymptomatic bacteriuria 05/20/2021   Late prenatal care in second trimester 05/16/2021   Supervision of high-risk pregnancy 05/13/2021   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   MDD (major depressive disorder), severe ([place]) 03/25/2018   Altered mental status 03/23/2018   Polysubstance abuse/including methamphetamine 03/23/2018   Essential hypertension 01/05/2017   Hx of preeclampsia, prior pregnancy, currently pregnant 12/25/2016   Tobacco use disorder 07/30/2016   Past Medical History:  Diagnosis Date   CHF (congestive heart failure) ([place])    Headache    Hypertension    Illicit drug use    MDD (major depressive disorder)    Mental disorder    PE (pulmonary thromboembolism) ([place]) 08/27/2021   [facility] CTA chest 08/27/21: Very small subsegmental left upper lobe pulmonary embolus   Polysubstance abuse ([place])    Postpartum cardiomyopathy     Family History  Problem Relation Age of Onset   Asthma Brother    Cancer Maternal Grandmother        breast    Past Surgical History:  Procedure Laterality Date   NO PAST SURGERIES     ORIF ANKLE FRACTURE Right 05/09/2022   Procedure: RIGHT OPEN REDUCTION INTERNAL FIXATION FIBULA  AND TIBIA;  Surgeon: Newt Minion, MD;  Location: [facility];  Service: Orthopedics;  Laterality: Right;   Social History   Occupational History   Not on file  Tobacco Use   Smoking status: Every Day  Packs/day: 0.40    Years: 9.00    Total pack years: 3.60    Types: Cigarettes   Smokeless tobacco: Never  Vaping Use   Vaping Use: Never used  Substance and Sexual Activity   Alcohol use: No   Drug use: Not Currently    Types: Marijuana, Methamphetamines    Comment: 05/04/21   Sexual activity: Yes    Birth control/protection: None

## 2022-05-27 ENCOUNTER — Telehealth: Payer: Self-pay

## 2022-05-27 ENCOUNTER — Encounter: Payer: Medicaid Other | Admitting: Family

## 2022-05-27 NOTE — Telephone Encounter (Signed)
This pt is s/p ORIF Pilon fx and Weber C fx and has NS her last three appts. She has not been in the office for post op follow up. Can you please call and try and resch?

## 2022-06-05 ENCOUNTER — Encounter: Payer: Medicaid Other | Admitting: Orthopedic Surgery

## 2022-06-09 ENCOUNTER — Encounter: Payer: Medicaid Other | Admitting: Orthopedic Surgery

## 2023-03-04 ENCOUNTER — Other Ambulatory Visit: Payer: Self-pay | Admitting: Obstetrics & Gynecology

## 2023-03-04 ENCOUNTER — Ambulatory Visit (INDEPENDENT_AMBULATORY_CARE_PROVIDER_SITE_OTHER): Payer: Medicaid Other | Admitting: *Deleted

## 2023-03-04 VITALS — BP 155/107 | HR 118 | Ht 59.0 in | Wt 118.0 lb

## 2023-03-04 DIAGNOSIS — Z3201 Encounter for pregnancy test, result positive: Secondary | ICD-10-CM

## 2023-03-04 DIAGNOSIS — Z8679 Personal history of other diseases of the circulatory system: Secondary | ICD-10-CM

## 2023-03-04 DIAGNOSIS — Z8759 Personal history of other complications of pregnancy, childbirth and the puerperium: Secondary | ICD-10-CM

## 2023-03-04 DIAGNOSIS — N926 Irregular menstruation, unspecified: Secondary | ICD-10-CM

## 2023-03-04 DIAGNOSIS — I10 Essential (primary) hypertension: Secondary | ICD-10-CM

## 2023-03-04 DIAGNOSIS — Z86711 Personal history of pulmonary embolism: Secondary | ICD-10-CM

## 2023-03-04 LAB — POCT URINE PREGNANCY: Preg Test, Ur: POSITIVE — AB

## 2023-03-04 MED ORDER — ASPIRIN 81 MG PO TBEC: 162.0000 mg | DELAYED_RELEASE_TABLET | Freq: Every day | ORAL | 12 refills | Status: AC

## 2023-03-04 MED ORDER — ENOXAPARIN SODIUM 40 MG/0.4ML IJ SOSY: 40.0000 mg | PREFILLED_SYRINGE | INTRAMUSCULAR | 6 refills | Status: AC

## 2023-03-04 MED ORDER — CARVEDILOL 6.25 MG PO TABS: 6.2500 mg | ORAL_TABLET | Freq: Two times a day (BID) | ORAL | 3 refills | Status: AC

## 2023-03-04 NOTE — Progress Notes (Signed)
   NURSE VISIT- PREGNANCY CONFIRMATION   SUBJECTIVE:  Laura Wagner is a 27 y.o. G23P1011 female at [redacted]w[redacted]d by certain LMP of Patient's last menstrual period was 01/27/2023 (approximate). Here for pregnancy confirmation.  Home pregnancy test: positive x 2   She reports no complaints.  She is not taking prenatal vitamins.  History of CHF, PE and CHTN but has not taken her medication in a while.   OBJECTIVE:  BP (!) 155/107 (BP Location: Left Arm, Patient Position: Sitting, Cuff Size: Normal)   Pulse (!) 118   Ht 4\' 11"  (1.499 m)   Wt 118 lb (53.5 kg)   LMP 01/27/2023 (Approximate)   Breastfeeding No   BMI 23.83 kg/m   Appears well, in no apparent distress  Results for orders placed or performed in visit on 03/04/23 (from the past 24 hours)  POCT urine pregnancy   Collection Time: 03/04/23  3:18 PM  Result Value Ref Range   Preg Test, Ur Positive (A) Negative    ASSESSMENT: Positive pregnancy test, [redacted]w[redacted]d by LMP   CHTN  PLAN: Schedule for dating ultrasound in 2 weeks and new ob Prenatal vitamins: plans to begin OTC ASAP   Nausea medicines: not currently needed   OB packet given: Yes Discussed with Dr Charlotta Newton and patient to begin Lovenox 40mg  daily, Aspirin 162mg  daily and restart Carvedilol.  Refer to [facility] cards   Jobe Marker  03/04/2023 3:21 PM

## 2023-03-04 NOTE — Progress Notes (Signed)
Patient came in for nurse visit And has extensive medical history including history of a pulmonary embolism CHF and postpartum cardiomyopathy -She is supposed to be on Eliquis as well as other cardiac medications -Plan to continue her carvedilol, restart Lovenox and aspirin 2 tabs daily -Referral to [facility] cards scheduled -Will plan for early OB visit at next available  Laura Hidalgo, DO Attending Obstetrician & Gynecologist, [facility] for [employer], [facility]

## 2023-03-13 ENCOUNTER — Other Ambulatory Visit: Payer: Self-pay | Admitting: Obstetrics & Gynecology

## 2023-03-13 DIAGNOSIS — O09299 Supervision of pregnancy with other poor reproductive or obstetric history, unspecified trimester: Secondary | ICD-10-CM

## 2023-03-13 DIAGNOSIS — Z86711 Personal history of pulmonary embolism: Secondary | ICD-10-CM

## 2023-03-13 DIAGNOSIS — O3680X0 Pregnancy with inconclusive fetal viability, not applicable or unspecified: Secondary | ICD-10-CM

## 2023-03-13 DIAGNOSIS — O10919 Unspecified pre-existing hypertension complicating pregnancy, unspecified trimester: Secondary | ICD-10-CM

## 2023-03-23 ENCOUNTER — Encounter: Payer: Medicaid Other | Admitting: *Deleted

## 2023-03-23 ENCOUNTER — Other Ambulatory Visit: Payer: Medicaid Other | Admitting: Radiology

## 2023-03-23 ENCOUNTER — Telehealth: Payer: Self-pay

## 2023-03-23 ENCOUNTER — Encounter: Payer: Medicaid Other | Admitting: Obstetrics & Gynecology

## 2023-03-23 NOTE — Telephone Encounter (Signed)
 Called both numbers on file; one isn't working, the other went straight to a voicemail that hasn't been set up. Sent mychart to give office a call but she hasn't been on there since 2023.

## 2023-04-01 ENCOUNTER — Encounter: Payer: Self-pay | Admitting: *Deleted

## 2023-05-19 ENCOUNTER — Other Ambulatory Visit

## 2023-05-20 ENCOUNTER — Other Ambulatory Visit: Admitting: Radiology

## 2023-05-20 ENCOUNTER — Encounter: Payer: Self-pay | Admitting: Radiology

## 2023-05-20 ENCOUNTER — Other Ambulatory Visit: Payer: Self-pay | Admitting: Obstetrics & Gynecology

## 2023-05-20 DIAGNOSIS — O3680X0 Pregnancy with inconclusive fetal viability, not applicable or unspecified: Secondary | ICD-10-CM

## 2023-05-20 DIAGNOSIS — Z3492 Encounter for supervision of normal pregnancy, unspecified, second trimester: Secondary | ICD-10-CM | POA: Diagnosis not present

## 2023-05-20 DIAGNOSIS — Z3A16 16 weeks gestation of pregnancy: Secondary | ICD-10-CM

## 2023-05-20 NOTE — Progress Notes (Signed)
 [place]:  unknown exact LMP  -   Dating/viability  -  LPNC Single active female fetus, breech, FHR = 157 bpm  posterior pl. Gr0   GA = [redacted]w[redacted]d    EDC = 10-30-23   no fetal abn seen  MVP = 3.9 cm nl ovaries  -  CL = 3.7 cm,  closed

## 2023-05-26 ENCOUNTER — Encounter: Admitting: Women's Health

## 2023-05-26 ENCOUNTER — Encounter

## 2024-01-18 IMAGING — DX DG CHEST 2V
2 series · 2 of 2 positions shown · non-contrast
Comparison: 08/29/2021

CLINICAL DATA: Respiratory abnormalities.

EXAM:
CHEST - 2 VIEW

[chest pa]
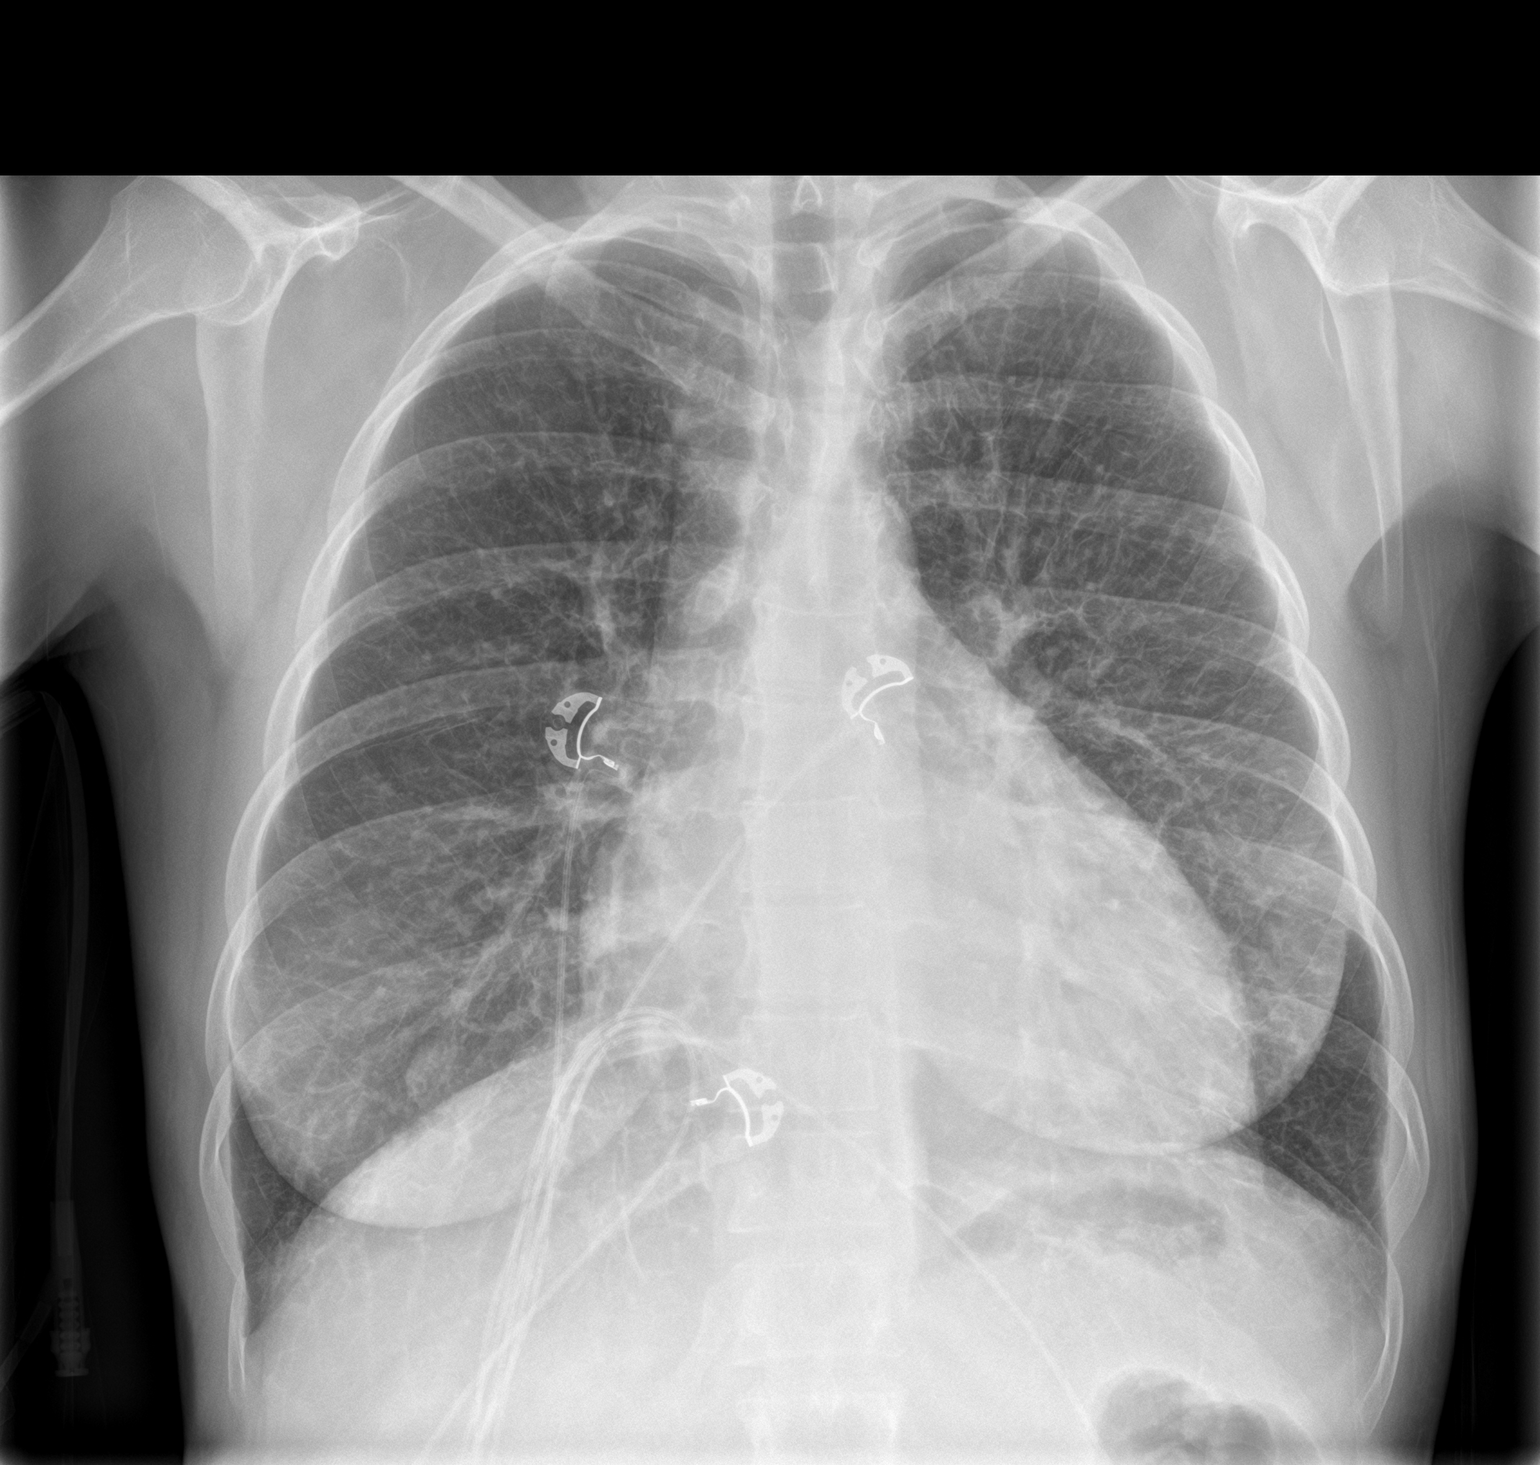

[chest lat]
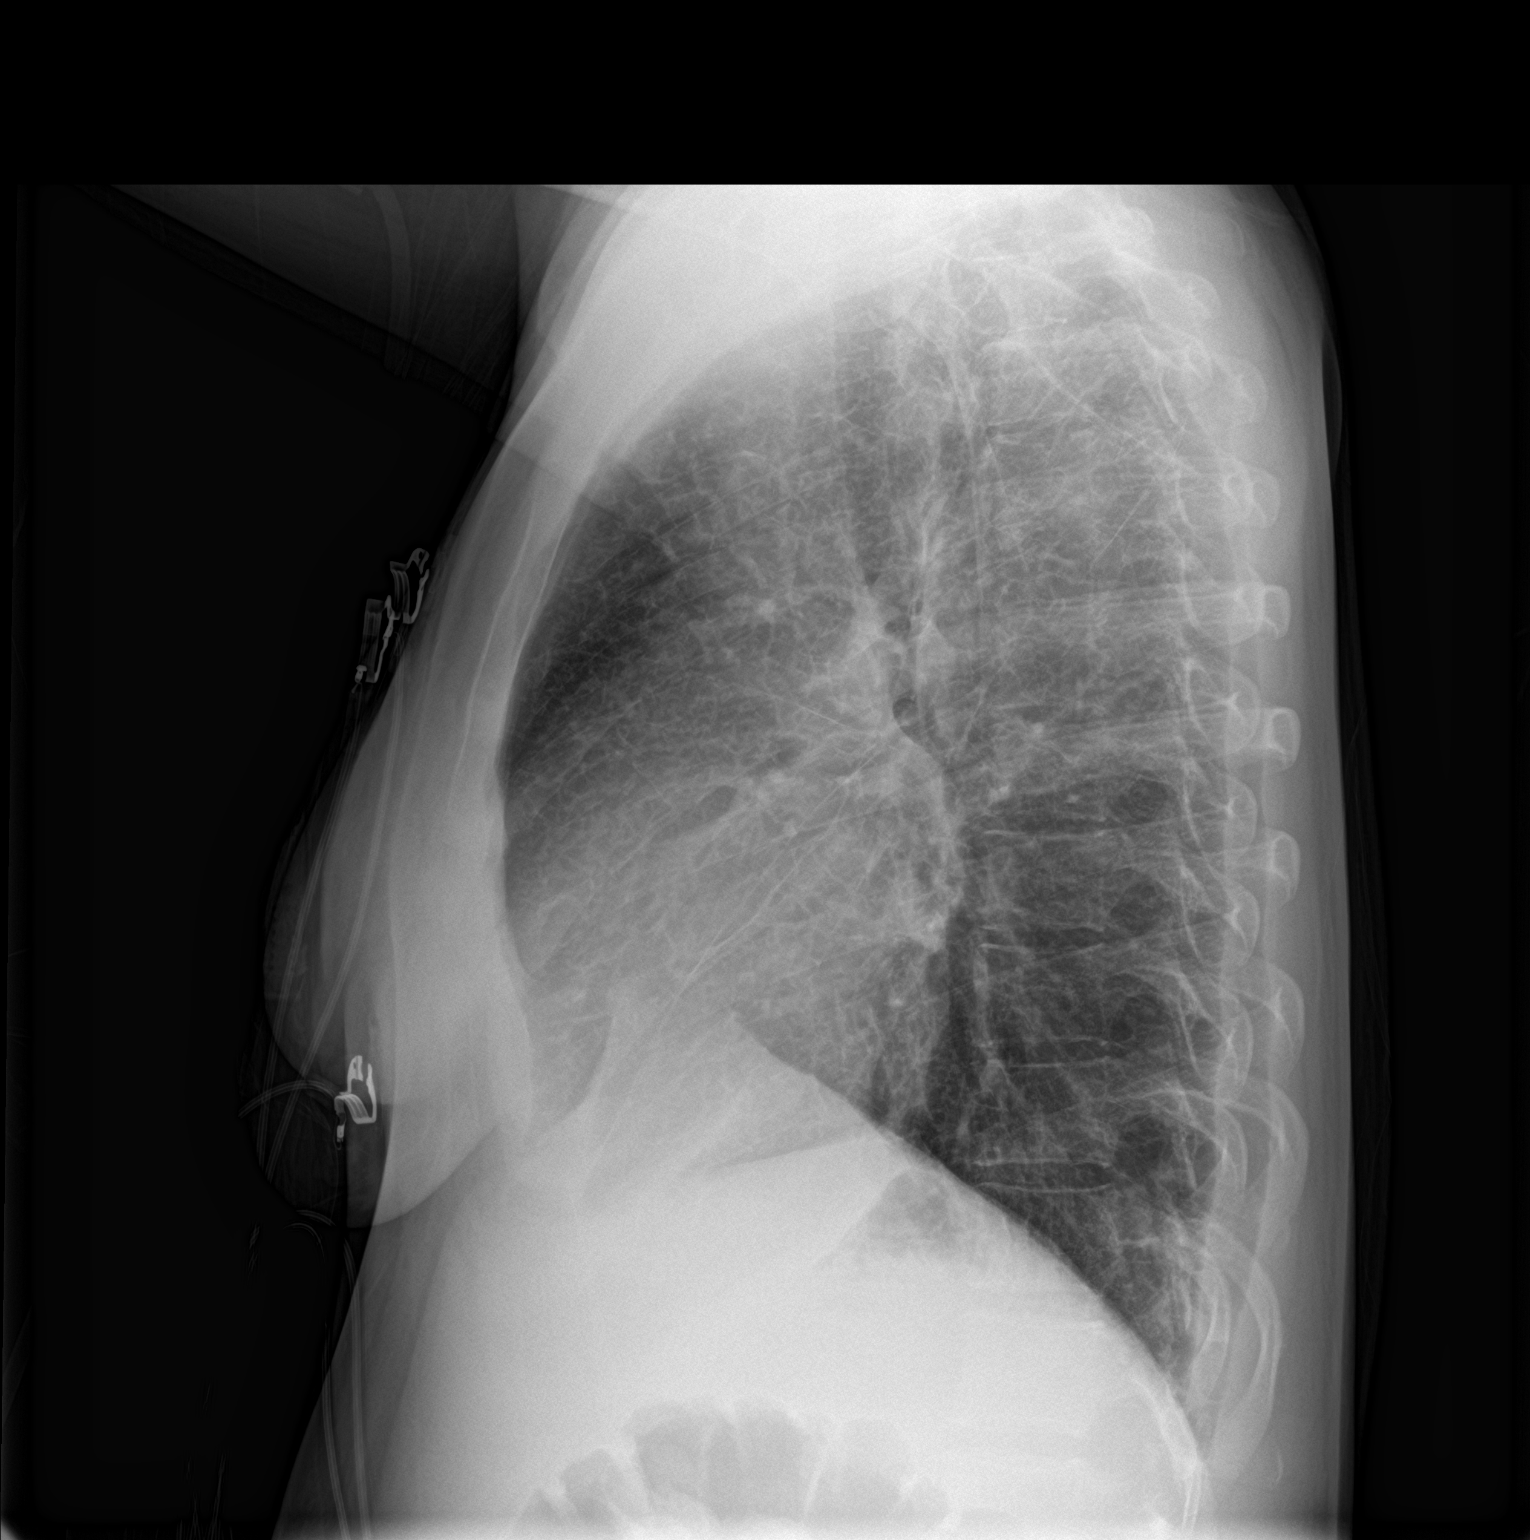

[2 of 2 positions shown; findings below may reference images not displayed]

FINDINGS: Lungs are adequately inflated without focal airspace consolidation.
Subtle stable prominence of the interstitium. Mild stable
cardiomegaly. Remainder of the exam is unchanged.
IMPRESSION: 1. No acute cardiopulmonary disease. Mild stable diffuse
interstitial prominence.
2. Mild stable cardiomegaly.
